# Patient Record
Sex: Male | Born: 1990 | Race: White | Hispanic: No | Marital: Single | Smoking: Current every day smoker
Health system: Southern US, Community
[De-identification: ages and names within clinical notes are randomized; demographics above are authoritative.]

## PROBLEM LIST (undated history)

## (undated) DIAGNOSIS — F329 Major depressive disorder, single episode, unspecified: Secondary | ICD-10-CM

## (undated) DIAGNOSIS — F1210 Cannabis abuse, uncomplicated: Secondary | ICD-10-CM

## (undated) DIAGNOSIS — F419 Anxiety disorder, unspecified: Secondary | ICD-10-CM

## (undated) DIAGNOSIS — F319 Bipolar disorder, unspecified: Secondary | ICD-10-CM

## (undated) DIAGNOSIS — F32A Depression, unspecified: Secondary | ICD-10-CM

---
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
## 2009-01-11 IMAGING — CR DG CHEST 1V PORT
1 series · 1 of 1 positions shown · non-contrast
Comparison: none

REASON FOR EXAM: blacked out
COMMENTS:

PROCEDURE:     DXR - DXR PORTABLE CHEST SINGLE VIEW  - May 17, 2008  [DATE]
RESULT:     The lung fields are clear.  The heart, mediastinal and osseous
structures show no significant abnormalities.

[view not recorded]
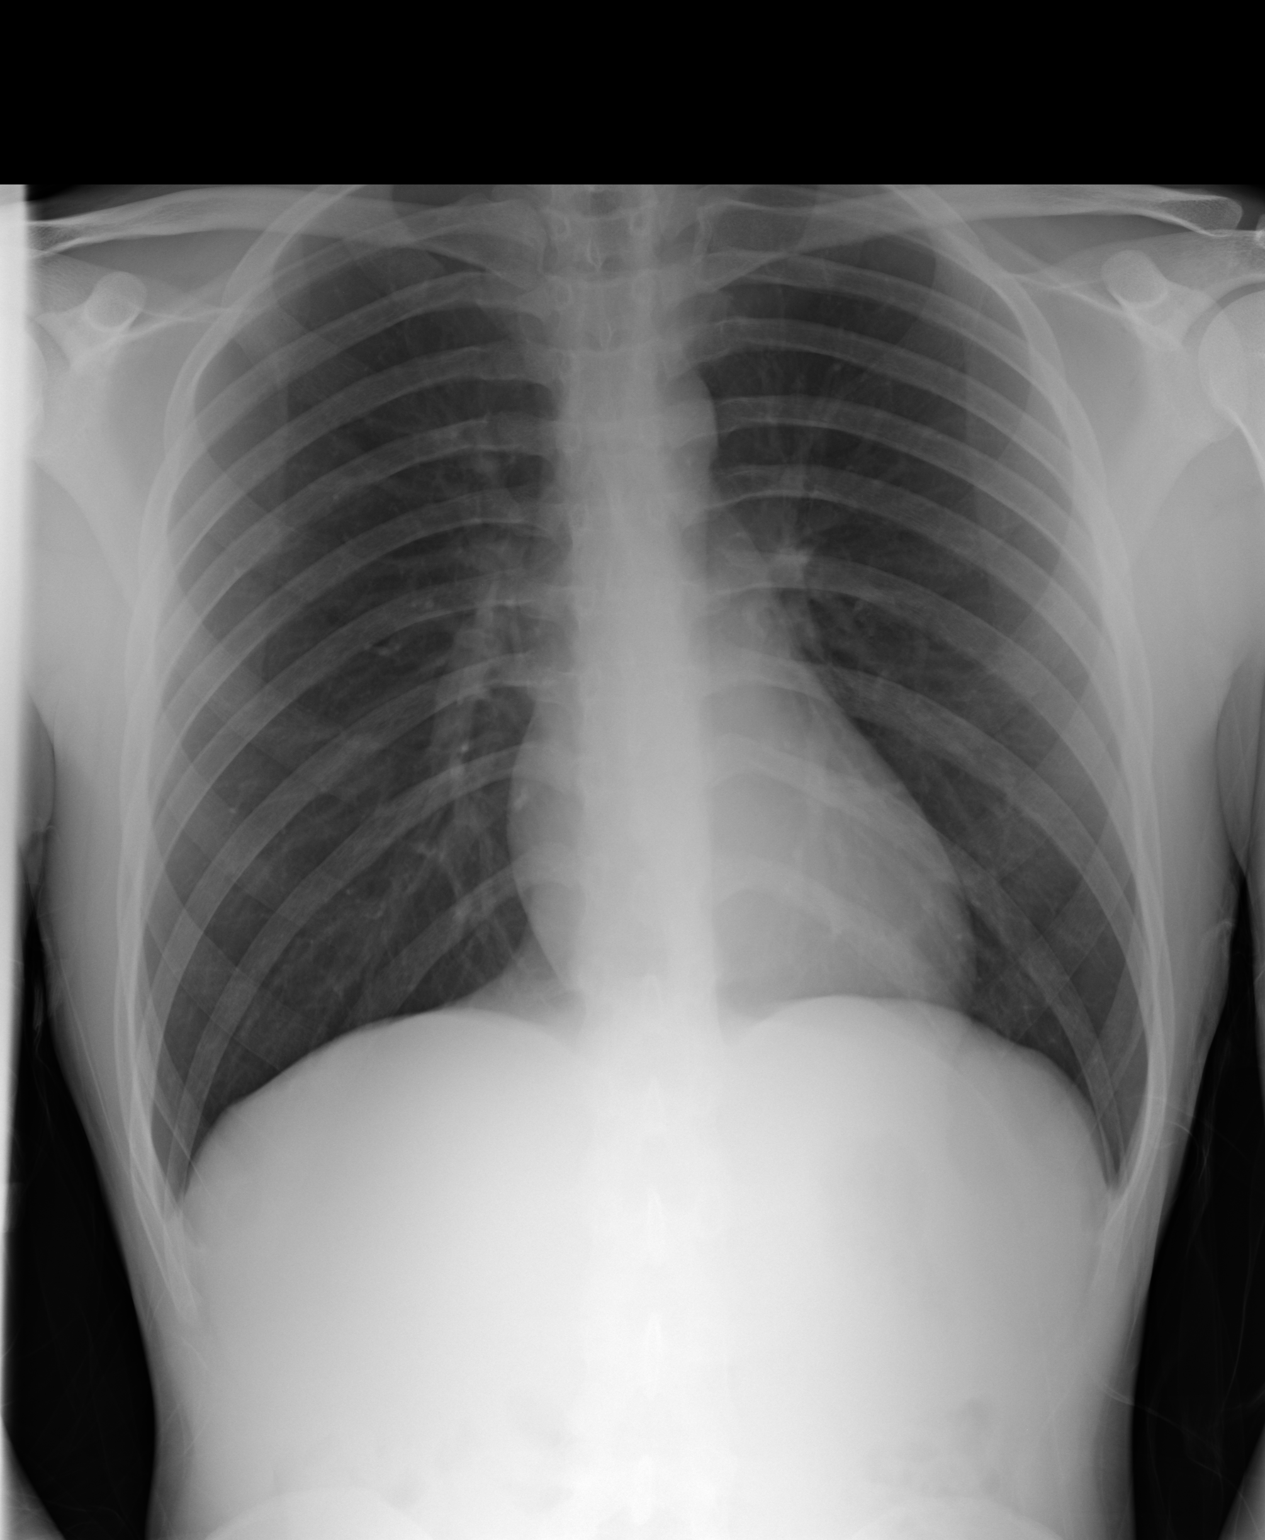

[1 of 1 positions shown; findings below may reference images not displayed]

IMPRESSION: No significant abnormalities are noted.

## 2014-05-12 LAB — COMPREHENSIVE METABOLIC PANEL
Albumin: 4.2 g/dL (ref 3.4–5.0)
Alkaline Phosphatase: 77 U/L
Anion Gap: 7 (ref 7–16)
BILIRUBIN TOTAL: 0.6 mg/dL (ref 0.2–1.0)
BUN: 14 mg/dL (ref 7–18)
CALCIUM: 8.9 mg/dL (ref 8.5–10.1)
CHLORIDE: 105 mmol/L (ref 98–107)
Co2: 26 mmol/L (ref 21–32)
Creatinine: 0.83 mg/dL (ref 0.60–1.30)
EGFR (African American): >60
Glucose: 99 mg/dL (ref 65–99)
OSMOLALITY: 276 (ref 275–301)
Potassium: 3.3 mmol/L — ABNORMAL LOW (ref 3.5–5.1)
SGOT(AST): 24 U/L (ref 15–37)
SGPT (ALT): 19 U/L (ref 12–78)
SODIUM: 138 mmol/L (ref 136–145)
Total Protein: 7.5 g/dL (ref 6.4–8.2)

## 2014-05-12 LAB — DRUG SCREEN, URINE
Amphetamines, Ur Screen: NEGATIVE (ref ?–1000)
BARBITURATES, UR SCREEN: NEGATIVE (ref ?–200)
Benzodiazepine, Ur Scrn: NEGATIVE (ref ?–200)
CANNABINOID 50 NG, UR ~~LOC~~: POSITIVE (ref ?–50)
COCAINE METABOLITE, UR ~~LOC~~: NEGATIVE (ref ?–300)
MDMA (ECSTASY) UR SCREEN: NEGATIVE (ref ?–500)
METHADONE, UR SCREEN: NEGATIVE (ref ?–300)
Opiate, Ur Screen: NEGATIVE (ref ?–300)
PHENCYCLIDINE (PCP) UR S: NEGATIVE (ref ?–25)
Tricyclic, Ur Screen: NEGATIVE (ref ?–1000)

## 2014-05-12 LAB — URINALYSIS, COMPLETE
Bacteria: NONE SEEN
Bilirubin,UR: NEGATIVE
Blood: NEGATIVE
GLUCOSE, UR: NEGATIVE mg/dL (ref 0–75)
LEUKOCYTE ESTERASE: NEGATIVE
NITRITE: NEGATIVE
PH: 6 (ref 4.5–8.0)
RBC,UR: 3 /HPF (ref 0–5)
SPECIFIC GRAVITY: 1.03 (ref 1.003–1.030)
Squamous Epithelial: <1
WBC UR: 6 /HPF (ref 0–5)

## 2014-05-12 LAB — ACETAMINOPHEN LEVEL

## 2014-05-12 LAB — CBC
HCT: 43.6 % (ref 40.0–52.0)
HGB: 14.5 g/dL (ref 13.0–18.0)
MCH: 29.9 pg (ref 26.0–34.0)
MCHC: 33.3 g/dL (ref 32.0–36.0)
MCV: 90 fL (ref 80–100)
Platelet: 254 10*3/uL (ref 150–440)
RBC: 4.85 10*6/uL (ref 4.40–5.90)
RDW: 12.8 % (ref 11.5–14.5)
WBC: 8.9 10*3/uL (ref 3.8–10.6)

## 2014-05-12 LAB — ETHANOL: Ethanol: <3 mg/dL

## 2014-05-12 LAB — SALICYLATE LEVEL: Salicylates, Serum: 3.9 mg/dL — ABNORMAL HIGH

## 2014-05-13 ENCOUNTER — Inpatient Hospital Stay: Payer: Self-pay | Admitting: Psychiatry

## 2014-05-18 LAB — POTASSIUM: Potassium: 4.2 mmol/L (ref 3.5–5.1)

## 2015-03-19 NOTE — H&P (Signed)
PATIENT NAME:  Bill Anderson, Bill Anderson MR#:  [id number] DATE OF BIRTH:  Sep 22, 1991  DATE OF ADMISSION:  05/13/2014  REFERRING PHYSICIAN: Emergency Room M.D.   ATTENDING PHYSICIAN: Jolanta B. Jennet MaduroPucilowska, M.D.   IDENTIFYING DATA: Mr. Bill Anderson is a 24 year old male with history of depression.   CHIEF COMPLAINT: "I'm dead already."    HISTORY OF PRESENT ILLNESS:  Mr. Bill Anderson reports a long history of depression since  childhood that worsened during his adolescence. At that time, he had several psychiatric hospitalizations for suicidal thoughts and also for cutting. He, more recently, has been tried on Risperdal and Celexa, did not feel that medications were helpful. In addition, the Risperdal gave him twitches. He usually is able to a fight depression with good coping skills that he learned during hospitalization, but at times he feels overwhelmed lately. After he lost his job 3 months ago, he started thinking of suicide more and thinking about ways to do it. On the day of admission, he superficially scratched his wrist again. He denies that it was a suicide attempt, but rather he was trying to substitute emotional pain with physical pain. He oftentimes feels that life is not worth living and he feels so depressed that he thinks that he is dead already and nothing will ever improve. He reports poor sleep, decreased appetite, anhedonia, feeling of guilt, hopelessness, worthlessness, poor energy and concentration, social isolation, crying spells, and recently suicidal thoughts. He reports some symptoms suggestive of bipolar disorder and believes that he was diagnosed with bipolar as a child. He, however, decided not ever take lithium or Depakote as he is very familiar with their side effects. He denies or symptoms of mania. There are no psychotic symptoms. He denies alcohol or a drug use, except for marijuana  that he uses regularity.   PAST PSYCHIATRIC HISTORY: [facility] hospital[facility] and at [facility]. He was tried on Risperdal and Celexa, probably other medications in the past as well. He has a history of cutting with 2 scars on his forearm , but denies that they were suicide attempts.   FAMILY PSYCHIATRIC HISTORY: Mother with bipolar.   ALLERGIES: NO KNOWN DRUG ALLERGIES.   MEDICATIONS ON ADMISSION: None.   SOCIAL HISTORY: He lives with his mother. They have a complicated relationship. He has a GED and some college. He is on probation after spending  time in jail for a year, was released in March.  Maybe this is why he is not working, that he is a felon. He has no health insurance.   REVIEW OF SYSTEMS: CONSTITUTIONAL: No fevers or chills. No weight changes.  EYES: No double or blurred vision.  ENT: No hearing loss.  RESPIRATORY: No shortness of breath or cough.  CARDIOVASCULAR: No chest pain or orthopnea.  GASTROINTESTINAL: No abdominal pain, nausea, vomiting, or diarrhea.  GENITOURINARY: No incontinence or frequency.  ENDOCRINE: No heat or cold intolerance.  LYMPHATIC: No anemia or easy bruising.  INTEGUMENTARY: No acne or rash.  MUSCULOSKELETAL: Positive for diffuse pain. He is hurting everywhere.  NEUROLOGIC: No tingling or weakness.  PSYCHIATRIC: See history of present illness for details.   PHYSICAL EXAMINATION:  VITAL SIGNS: Blood pressure 120/76, pulse 52, respirations 18, temperature 98.2.  GENERAL: This is a slender young male in no acute distress.  HEENT: The pupils are equal, round, and reactive to light. Sclerae anicteric.  NECK: Supple. No thyromegaly.  LUNGS: Clear to auscultation. No dullness to percussion.  HEART: Regular rhythm and rate. No murmurs, rubs,  or gallops.  ABDOMEN: Soft, nontender, nondistended. Positive bowel sounds.  MUSCULOSKELETAL: Normal muscle strength in all extremities.  SKIN: No rashes or bruises.  LYMPHATIC: No cervical adenopathy.  NEUROLOGIC: Cranial nerves II-XII are intact.   LABORATORY DATA: Chemistries are within normal  limits with potassium 3.3. Blood alcohol level is 0. LFTs within normal limits. Urine toxicology screen positive for cannabinoids. CBC within normal limits. Urinalysis is not suggestive of urinary tract infection. Serum acetaminophen less than 2. Serum salicylates 3.9.  MENTAL STATUS EXAMINATION: On admission, the patient is alert and oriented to person, place, time and situation. He is pleasant, polite and cooperative. He is adequately groomed, long hair covering his eyes. He maintains good eye contact. His speech is of normal rhythm, rate and volume. Mood is depressed with normal affect. Thought process is logical and goal oriented. THOUGHT CONTENT: He denies thoughts of hurting himself or others actively, but oftentimes has passive thoughts of suicide or not wanting to be here and cut his wrist prior to admission. There are no delusions or paranoia. There are no auditory or visual hallucinations. His cognition is grossly intact. His registration and recall, long and short-term memory seem intact. He is quite intelligent, good fund of knowledge.  Adequate insight and judgment.   DIAGNOSES:  AXIS I: Bipolar affective disorder, depressed, and cannabis abuse.  AXIS II: Deferred.  AXIS III: Deferred.  AXIS IV: Mental illness, substance abuse, access to care, treatment compliance, family conflict, employment, and legal.  AXIS V: Global assessment of functioning 25.   PLAN: The patient was admitted to [facility] Medicine Unit for safety, stabilization, and medication management. He was initially placed on suicide precautions and was closely monitored for any unsafe behaviors. He underwent full psychiatric and risk assessment. He received pharmacotherapy, individual and group psychotherapy, substance abuse counseling, and support from therapeutic milieu.   1.  Suicidal ideation, the patient is able to contract for safety.  2.  He was started on Remeron by Dr. Garnetta Buddy. We  discussed mood stabilizers at length. He agreed to try Tegretol and Latuda.   DISPOSITION: He will be discharged to home.   SUBSTANCE ABUSE: He is not interested in substance abuse treatment.   ____________________________ Ellin Goodie. Jennet Maduro, MD jbp:ts D: 05/14/2014 14:58:50 ET T: 05/14/2014 15:43:46 ET JOB#: [id number]  cc: Jolanta B. Jennet Maduro, MD, <Dictator> Shari Prows MD ELECTRONICALLY SIGNED 06/09/2014 4:48

## 2015-03-19 NOTE — Consult Note (Signed)
PATIENT NAME:  Bill Anderson, Bill Anderson MR#:  [id number] DATE OF BIRTH:  1991-05-07  DATE OF CONSULTATION:  05/17/2014  REFERRING PHYSICIAN:  Jolanta B. Jennet Maduro, MD CONSULTING PHYSICIAN:  Vipul S. Sherryll Burger, MD  PRIMARY CARE PHYSICIAN: None.   REASON FOR CONSULTATION: Penile ulcer.   HISTORY OF PRESENT ILLNESS: The patient is a 24 year old male with no known medical history. He is being seen for penile bump. The patient was admitted to psychiatry service on 18th of June for depression and bipolar disorder, is being managed by them but was noticed to have a small furuncle on his penis, for which we are being consulted for further evaluation and management. The patient reports having tick bite a couple of days ago before being admitted to the hospital, for which he was having some itching and started having small pimple on his penis, which started getting pustular and he drained by himself and is open. Had some pus coming out yesterday and has been feeling much better since then but has been having some itching, for which he requested physician to evaluate.   PAST MEDICAL HISTORY: None.   PAST PSYCHIATRIC HISTORY: Bipolar disorder. Several hospitalizations at [facility], at [facility].   ALLERGIES: No known drug allergies.   SOCIAL HISTORY: No smoking. No alcohol. No IV drugs of abuse.  FAMILY HISTORY: Mother with bipolar disorder.   MEDICATIONS AT HOME: None.   SOCIAL HISTORY: He lives with his mother. They have complicated relationship. He is on probation after spending time in a jail for a year. He was released in March.   REVIEW OF SYSTEMS: CONSTITUTIONAL: No fever, fatigue, weakness.  EYES: No blurred or double vision.  ENT: No tinnitus or ear pain.  RESPIRATORY: No cough, wheezing, hemoptysis.  CARDIOVASCULAR: No chest pain, orthopnea, edema.  GASTROINTESTINAL: No nausea, vomiting, diarrhea. GENITOURINARY: No dysuria or hematuria. He does have a small healing pustule on his penile  shaft. No signs of infection around it.  SKIN: As mentioned above.  MUSCULOSKELETAL: Generalized achiness.  NEUROLOGIC: No tingling, numbness, weakness.  PSYCHIATRIC: Positive for depression.   PHYSICAL EXAMINATION: VITAL SIGNS: Temperature 98.2, heart rate 76 per minute, respirations 18 per minute, blood pressure 128/89 mmHg. He is saturating 98% on room air.  GENERAL: The patient is a 24 year old male lying in the bed comfortably without any acute distress.  EYES: Pupils equal, round, reactive to light and accommodation. No scleral icterus. Extraocular muscles intact.  HENT: Head atraumatic, normocephalic. Oropharynx and nasopharynx clear.  NECK: Supple. No jugular venous distention. No thyroid enlargement or tenderness.  LUNGS: Clear to auscultation bilaterally. No wheezing, rales, rhonchi or crepitation.  CARDIOVASCULAR: S1, S2 normal. No murmur, rubs or gallop.  ABDOMEN: Soft, nontender, nondistended. Bowel sounds present. No organomegaly or mass.  EXTREMITIES: No pedal edema, cyanosis or clubbing.  GENITOURINARY: On his penile area in the midshaft region on the dorsal aspect, he has a 0.25 cm healing pustule. No signs of infection around it. It seems like his pulse is already out. No tenderness around it. No signs of excoriation around it.  NEUROLOGIC: Cranial nerves II through XII intact. Muscle strength 5/5 in all extremities. Sensation intact.  PSYCHIATRIC: The patient is alert and oriented x 3. He seems somewhat anxious. He keeps requesting some cream to apply on his penis even though we talked about it for long, that there is no benefit to it. He is specifically requesting steroid cream.   LABORATORY DATA: The last laboratory checked was on 17th of June when  he had a potassium of 3.3, otherwise normal. Urine tox was positive for cannabinoid. CBC was within normal limits.   IMPRESSION AND PLAN: Penile lesion, possibly from tick bite. We will start him on doxycycline for now. The pus   is already out. Will get rickettsial antibody along with Ehrlichia antibody. Will also check for sexually transmitted diseases, as he has been requesting. Blood test has been ordered. We will get routine labs for the morning.   CODE STATUS: Full code.   TOTAL TIME TAKING CARE OF THIS PATIENT: 35 minutes.   ____________________________ Ellamae SiaVipul S. Sherryll BurgerShah, MD vss:jcm D: 05/17/2014 17:26:03 ET T: 05/17/2014 19:57:45 ET JOB#: [id number]  cc: Vipul S. Sherryll BurgerShah, MD, <Dictator> Jolanta B. Jennet MaduroPucilowska, MD Patricia PesaVIPUL S SHAH MD ELECTRONICALLY SIGNED 05/20/2014 12:34

## 2015-03-19 NOTE — Consult Note (Signed)
PATIENT NAME:  Bill Anderson, Bill Anderson MR#:  [id number] DATE OF BIRTH:  December 09, 1990  DATE OF CONSULTATION:  05/13/2014  REFERRING PHYSICIAN:   CONSULTING PHYSICIAN:  Uzma S. Garnetta Buddy, MD  REASON FOR CONSULTATION: "I don't want to deal with life."   HISTORY OF PRESENT ILLNESS: Patient is a 24 year old single Caucasian male who presented to the ED on a voluntary basis. He reported that his mother gave him a ride. He reported that he has been feeling progressively depressed now for a long time. Reported that it has been there for a long time. He stated that he superficially cut himself yesterday as a knife was not too sharp. Reported that he was brought to the hospital by his mother. He feels depressed, hopeless, and helpless. Has been having suicidal ideations with a plan to hurt himself. Reported that he does not want to be here anymore. He smokes weed on a daily basis. He has started using weed since he was 24 years old. He is currently unemployed and has not been working for the past three months. He does not feel good. He does not remember how many hours he has been sleeping. He stated that he does not eat well and does not have much to lose at this time. He reported that he was started on Celexa while he was incarcerated last year and was released in March. He reported that he was incarcerated for breaking and entering, but he took the blame for his friend. He is currently on six month probation. The patient reported that he has uncontrolled pain and he feels sore. He reported that he does not want to deal with life anymore and is unable to contract for safety at this time.   PAST PSYCHIATRIC HISTORY: The patient reported that he was started on Celexa in the prison and has been taking the medication, but it is not helpful. He currently denied any previous history of suicide attempt but is unable to control his emotions.   FAMILY HISTORY: The patient reported that he does not have any family history of mental illness.    ALLERGIES: NO KNOWN DRUG ALLERGIES.  SUBSTANCE ABUSE HISTORY: The patient reported using marijuana since he was in his teenage years. He denied using alcohol and other illicit drugs.   PAST MEDICAL HISTORY: The patient currently denied having any medical problems including diabetes, GERD, or hypertension.   SOCIAL HISTORY: He currently lives with his mother. He has completed his GED. He is currently on probation. Does not have any relationship at this time.   REVIEW OF SYSTEMS:  CONSTITUTIONAL: Denies any fever or chills. No weight changes.  EYES: No double or blurred vision.  RESPIRATORY: No shortness of breath or cough.  CARDIOVASCULAR: No chest pain or orthopnea.  GASTROINTESTINAL: No abdominal pain, nausea, vomiting, or diarrhea.  GENITOURINARY: No incontinence or frequency.  ENDOCRINE: No heat or cold intolerance.  LYMPHATIC: No anemia or easy bruising.  INTEGUMENTARY: No acne or rash.  MUSCULOSKELETAL: Complaining of muscle pain.  NEUROLOGIC: No tingling or weakness.   VITAL SIGNS: Temperature is 97.3, pulse is 68, respirations 18, blood pressure 119/56.  LABORATORY DATA: Glucose 99, BUN 14, creatinine 0.83, sodium 138, potassium 3.3, chloride 105, bicarbonate 26, anion gap 7, and calcium 8.9. Blood alcohol level less than three. Protein 7.5, albumin 4.2, bilirubin 0.6, alkaline phosphatase 77, AST 24, ALT 19. UDS is positive for cannabinoids. WBC 8.9, RBC 4.85, hemoglobin 14.5, hematocrit 43.6, MCV 90, RDW 12.8.   MENTAL STATUS EXAM: The patient is  a thinly built male who was lying in the bed. He appears thinly built, muscle tone appears normal. Gait and station was within normal limits. Speech was low in tone and volume. Thought process was logical, goal-directed. Thought content was nondelusional. His insight and judgment were fair. He is awake, alert and oriented x 3. Recent and remote memory were intact. Attention span and concentration were normal. Language was intact. Fund  of knowledge appears normal. Mood is depressed. Affect is congruent having suicidal ideations with a plan to hurt himself.   DIAGNOSTIC IMPRESSION:  AXIS I: Major depressive disorder, recurrent and severe, without psychotic features.  AXIS II: None.  AXIS III: None reported.   TREATMENT PLAN:  1.  The patient is currently on involuntary commitment and [facility] for [facility] and safety.  2.  He will be started on Remeron 15 mg p.o. at bedtime for his depressive symptoms.  3.  He will be evaluated by the treatment team and his medications will be adjusted.  Thank you for allowing me to participate in the care of this patient.    ____________________________ Ardeen FillersUzma S. Garnetta BuddyFaheem, MD usf:ts D: 05/13/2014 13:14:08 ET T: 05/13/2014 13:28:09 ET JOB#: [id number]  cc: Ardeen FillersUzma S. Garnetta BuddyFaheem, MD, <Dictator> Rhunette CroftUZMA S FAHEEM MD ELECTRONICALLY SIGNED 05/13/2014 16:53

## 2015-03-19 NOTE — Consult Note (Signed)
Brief Consult Note: Diagnosis: penile ulcer.   Patient was seen by consultant.   Consult note dictated.   Recommend further assessment or treatment.   Orders entered.   Comments: * penile ulcer: he claims from tick bite which is very possible, will start doxy for now and get titers for ricketssial dz,. also check for STDs, blood tests ordered.  full code.  Electronic Signatures: Patricia PesaShah, Vipul S (MD)  (Signed 22-Jun-15 15:44)  Authored: Brief Consult Note   Last Updated: 22-Jun-15 15:44 by Patricia PesaShah, Vipul S (MD)

## 2018-03-20 ENCOUNTER — Encounter: Payer: Self-pay | Admitting: Emergency Medicine

## 2018-03-20 ENCOUNTER — Emergency Department
Admission: EM | Admit: 2018-03-20 | Discharge: 2018-03-23 | Disposition: A | Discharge status: Other Acute Inpatient Hospital | Payer: No Typology Code available for payment source | Attending: Emergency Medicine | Admitting: Emergency Medicine

## 2018-03-20 DIAGNOSIS — F17210 Nicotine dependence, cigarettes, uncomplicated: Secondary | ICD-10-CM | POA: Insufficient documentation

## 2018-03-20 DIAGNOSIS — F1210 Cannabis abuse, uncomplicated: Secondary | ICD-10-CM

## 2018-03-20 DIAGNOSIS — F1910 Other psychoactive substance abuse, uncomplicated: Secondary | ICD-10-CM | POA: Insufficient documentation

## 2018-03-20 DIAGNOSIS — F319 Bipolar disorder, unspecified: Secondary | ICD-10-CM

## 2018-03-20 DIAGNOSIS — R45851 Suicidal ideations: Secondary | ICD-10-CM | POA: Insufficient documentation

## 2018-03-20 DIAGNOSIS — Z9119 Patient's noncompliance with other medical treatment and regimen: Secondary | ICD-10-CM

## 2018-03-20 DIAGNOSIS — Z91199 Patient's noncompliance with other medical treatment and regimen due to unspecified reason: Secondary | ICD-10-CM

## 2018-03-20 DIAGNOSIS — T1491XA Suicide attempt, initial encounter: Secondary | ICD-10-CM

## 2018-03-20 HISTORY — DX: Major depressive disorder, single episode, unspecified: F32.9

## 2018-03-20 HISTORY — DX: Depression, unspecified: F32.A

## 2018-03-20 HISTORY — DX: Anxiety disorder, unspecified: F41.9

## 2018-03-20 LAB — CBC
HCT: 42.7 % (ref 40.0–52.0)
HEMOGLOBIN: 14.9 g/dL (ref 13.0–18.0)
MCH: 30.1 pg (ref 26.0–34.0)
MCHC: 35 g/dL (ref 32.0–36.0)
MCV: 85.9 fL (ref 80.0–100.0)
PLATELETS: 251 10*3/uL (ref 150–440)
RBC: 4.97 MIL/uL (ref 4.40–5.90)
RDW: 12.8 % (ref 11.5–14.5)
WBC: 13.1 10*3/uL — AB (ref 3.8–10.6)

## 2018-03-20 LAB — URINE DRUG SCREEN, QUALITATIVE (ARMC ONLY)
AMPHETAMINES, UR SCREEN: NOT DETECTED
Barbiturates, Ur Screen: NOT DETECTED
Benzodiazepine, Ur Scrn: NOT DETECTED
Cannabinoid 50 Ng, Ur ~~LOC~~: POSITIVE — AB
Cocaine Metabolite,Ur ~~LOC~~: NOT DETECTED
MDMA (ECSTASY) UR SCREEN: NOT DETECTED
Methadone Scn, Ur: NOT DETECTED
Opiate, Ur Screen: NOT DETECTED
Phencyclidine (PCP) Ur S: NOT DETECTED
TRICYCLIC, UR SCREEN: NOT DETECTED

## 2018-03-20 LAB — COMPREHENSIVE METABOLIC PANEL
ALT: 17 U/L (ref 17–63)
AST: 19 U/L (ref 15–41)
Albumin: 4.9 g/dL (ref 3.5–5.0)
Alkaline Phosphatase: 74 U/L (ref 38–126)
Anion gap: 7 (ref 5–15)
BUN: 14 mg/dL (ref 6–20)
CO2: 25 mmol/L (ref 22–32)
Calcium: 9.4 mg/dL (ref 8.9–10.3)
Chloride: 106 mmol/L (ref 101–111)
Creatinine, Ser: 0.77 mg/dL (ref 0.61–1.24)
Glucose, Bld: 109 mg/dL — ABNORMAL HIGH (ref 65–99)
POTASSIUM: 3.9 mmol/L (ref 3.5–5.1)
SODIUM: 138 mmol/L (ref 135–145)
Total Bilirubin: 0.8 mg/dL (ref 0.3–1.2)
Total Protein: 7.4 g/dL (ref 6.5–8.1)

## 2018-03-20 LAB — ACETAMINOPHEN LEVEL: Acetaminophen (Tylenol), Serum: <10 ug/mL — ABNORMAL LOW (ref 10–30)

## 2018-03-20 LAB — ETHANOL

## 2018-03-20 LAB — SALICYLATE LEVEL

## 2018-03-20 NOTE — ED Notes (Signed)
Snack and beverage given. 

## 2018-03-20 NOTE — ED Notes (Signed)
Pt. Talking to SOC psychiatrist. 

## 2018-03-20 NOTE — ED Notes (Signed)
Pt dressed out. Pt belongings bag: Pair of shoes, pants, briefs, shirt, bracelet.

## 2018-03-20 NOTE — ED Notes (Signed)
Per Dr Don PerkingVeronese 1:1 sitter to be lifted [facility]

## 2018-03-20 NOTE — ED Notes (Signed)
PT IVC PENDING SOC CONSULT/SOC CALLED. 

## 2018-03-20 NOTE — ED Notes (Signed)
IV[facility]/Consult ordered/[facility]

## 2018-03-20 NOTE — ED Notes (Signed)
Report to include Situation, Background, Assessment, and Recommendations received from GiGi RN. Patient alert and oriented, warm and dry, in no acute distress. Patient denies SI, HI, AVH and pain. Patient made aware of Q15 minute rounds and security cameras for their safety. Patient instructed to come to me with needs or concerns. 

## 2018-03-20 NOTE — ED Notes (Signed)
Report given to [facility] GiGi RN 
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
## 2018-03-20 NOTE — ED Notes (Signed)
Patient got transferred from main ED.Patient alert and oriented.Patient states "because of my mother I am here now.Please excuse me if I am rude I don't want to talk now."Informed patient about the 15 mts checks and the security ca [place]us[place].Encouraged patient to let nursing staff know any concern or need.
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
## 2018-03-20 NOTE — ED Notes (Signed)
Hourly rounding reveals patient in room. No complaints, stable, in no acute distress. Q15 minute rounds and monitoring via Security Cameras to continue. 

## 2018-03-20 NOTE — ED Triage Notes (Signed)
Pt comes into the ED via [employer] for IVC due to suicidal attempt. Patient was tased by police officers because he had a knife held up against his heart.  Patient has SI but denies HI at this time.  Patient explains that he doesn't want "to hurt myself, but if you asked me if I wanted to kill myself then it would be a different answer".  Patient is calm and cooperative at this time and in NAD.  Patient states he has a h/o depression, anxiety, and other mental health disorders.

## 2018-03-21 DIAGNOSIS — F1210 Cannabis abuse, uncomplicated: Secondary | ICD-10-CM

## 2018-03-21 DIAGNOSIS — F319 Bipolar disorder, unspecified: Secondary | ICD-10-CM

## 2018-03-21 DIAGNOSIS — Z91199 Patient's noncompliance with other medical treatment and regimen due to unspecified reason: Secondary | ICD-10-CM

## 2018-03-21 DIAGNOSIS — Z9119 Patient's noncompliance with other medical treatment and regimen: Secondary | ICD-10-CM

## 2018-03-21 HISTORY — DX: Bipolar disorder, unspecified: F31.9

## 2018-03-21 HISTORY — DX: Cannabis abuse, uncomplicated: F12.10

## 2018-03-21 MED ORDER — OLANZAPINE 10 MG PO TABS
10.0000 mg | ORAL_TABLET | Freq: Four times a day (QID) | ORAL | Status: DC | PRN
  Administered 2018-03-21: 10 mg via ORAL
  Filled 2018-03-21 (×2): qty 1

## 2018-03-21 MED ORDER — ZIPRASIDONE MESYLATE 20 MG IM SOLR
20.0000 mg | Freq: Four times a day (QID) | INTRAMUSCULAR | Status: DC | PRN
  Administered 2018-03-22: 20 mg via INTRAMUSCULAR
  Filled 2018-03-21: qty 20

## 2018-03-21 MED ORDER — LURASIDONE HCL 40 MG PO TABS
40.0000 mg | ORAL_TABLET | Freq: Every day | ORAL | Status: DC
  Administered 2018-03-21 – 2018-03-23 (×3): 40 mg via ORAL
  Filled 2018-03-21 (×3): qty 1

## 2018-03-21 NOTE — ED Notes (Signed)
Patient in dayroom on phone.

## 2018-03-21 NOTE — ED Notes (Signed)
Patient in day room. 
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
## 2018-03-21 NOTE — ED Notes (Signed)
IVC/ Consult completed/ Plan to admit when medically clear  
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
## 2018-03-21 NOTE — ED Notes (Signed)
Hourly rounding reveals patient sleeping in room. No complaints, stable, in no acute distress. Q15 minute rounds and monitoring via Security Cameras to continue. 

## 2018-03-21 NOTE — BH Assessment (Signed)
Assessment Note  Bill Anderson is an [age] y.o. male.IVC pt brought into ED by PD due to threatening to stab himself in the heart with a knife. Pt endorses SI and no HI. Pt has legal issues including being on the sex offender registry and was recently released from prison. Pt stated several times during assessment, "It's hard being human garbage." Writer witnessed pt being argumentative and ill[facility] stating, "I'm probably 10x smarter than you. I'll get your license revoked. Ignore the problem like everyone else does." Pt blames mother for a great deal of his current problems stating that she refuses to help him get a license, which he feels will solve many of his issues. Pt stated, "I don't want to exist on this planet." Pt displayed no efforts of accountability or remorse for behaviors or attitude.   At time of assessment, pt very agitated and unable to provide logical content for some questions on assessment. Pt endorses no HI, AH, or VH.  Diagnosis: Depression  Past Medical History:  Past Medical History:  Diagnosis Date  . Anxiety   . Depression     History reviewed. No pertinent surgical history.  Family History: No family history on file.  Social History:  reports that he has been smoking cigarettes.  He has been smoking about 1.00 pack per day. He has never used smokeless tobacco. He reports that he drinks alcohol. He reports that he has current or past drug history. Drug: Marijuana.  Additional Social History:  Alcohol / Drug Use Pain Medications: see PTA Prescriptions: see PTA Over the Counter: see PTA History of alcohol / drug use?: Yes Longest period of sobriety (when/how long): unknown Negative Consequences of Use: Legal, Financial Substance #1 Name of Substance 1: marijuana 1 - Age of First Use: 10 1 - Amount (size/oz): varies 1 - Frequency: daily 1 - Duration: varies 1 - Last Use / Amount: yesterday Substance #2 Name of Substance 2: alcohol 2 -  Age of First Use: unknown 2 - Amount (size/oz): varies 2 - Frequency: varies 2 - Duration: varies  2 - Last Use / Amount: unknown  CIWA: CIWA-Ar BP: 123/80 Pulse Rate: 89 COWS:    Allergies: No Known Allergies  Home Medications:  (Not in a hospital admission)  OB/GYN Status:  No LMP for male patient.  General Assessment Data Location of Assessment: [facility] ED T[facility] Assessment: In system Is this a Tele or Face-to-Face Assessment?: Face-to-Face Is this an Initial Assessment or a Re-assessment for this encounter?: Initial Assessment Marital status: Single Maiden name: N/A Is patient pregnant?: No Pregnancy Status: No Living Arrangements: Parent(PT lives with mother) Can pt return to current living arrangement?: Yes Admission Status: Involuntary Is patient capable of signing voluntary admission?: No Referral Source: Self/Family/Friend Insurance type: None  Medical Screening Exam [facility] ONLY) Medical Exam completed: Yes  Crisis Care Plan Living Arrangements: Parent(PT lives with mother) Legal Guardian: (N/A) Name of Psychiatrist: None indicated Name of Therapist: None indicated  Education Status Is patient currently in school?: No Is the patient employed, unemployed or receiving disability?: Unemployed  Risk to self with the past 6 months Suicidal Ideation: Yes-Currently Present Has patient been a risk to self within the past 6 months prior to admission? : Yes Suicidal Intent: Yes-Currently Present Has patient had any suicidal intent within the past 6 months prior to admission? : Yes Is patient at risk for suicide?: Yes Suicidal Plan?: Yes-Currently Present Has patient had any suicidal plan within the past 6  months prior to admission? : Yes Specify Current Suicidal Plan: Pt has plan of stabbing himself in the chest with a knife Access to Means: Yes Specify Access to Suicidal Means: knives within home What has been your use of drugs/alcohol within the last 12  months?: pt reports "I only smoke weed" Previous Attempts/Gestures: (Unknown) How many times?: (Unknown) Other Self Harm Risks: None indicated Triggers for Past Attempts: Family contact, Spouse contact(Legal issues, contact with mom and girlfriend) Intentional Self Injurious Behavior: None Family Suicide History: No Recent stressful life event(s): Conflict (Comment), Trauma (Comment), Turmoil (Comment), Legal Issues Persecutory voices/beliefs?: No Depression: Yes Depression Symptoms: Isolating, Feeling angry/irritable, Feeling worthless/self pity, Loss of interest in usual pleasures Substance abuse history and/or treatment for substance abuse?: Yes Suicide prevention information given to non-admitted patients: Yes  Risk to Others within the past 6 months Homicidal Ideation: No Does patient have any lifetime risk of violence toward others beyond the six months prior to admission? : Yes (comment) Thoughts of Harm to Others: No Current Homicidal Intent: No Current Homicidal Plan: No Access to Homicidal Means: No Identified Victim: N/A History of harm to others?: (Pt charges for breaking/entering and is on sexual offend reg) Assessment of Violence: On admission Violent Behavior Description: Pt reportedly became irate towards mother and threatened to stab himself in the chest. Pt  Does patient have access to weapons?: Yes (Comment)(Knives in home) Criminal Charges Pending?: No(None found) Does patient have a court date: No(None found) Is patient on probation?: Unknown  Psychosis Hallucinations: None noted Delusions: None noted  Mental Status Report Appearance/Hygiene: Disheveled, Unremarkable Eye Contact: Fair Motor Activity: Agitation Speech: Aggressive, Rapid, Word salad Level of Consciousness: Alert, Irritable Mood: Irritable, Angry, Anxious Affect: Angry, Irritable, Preoccupied Anxiety Level: Minimal Thought Processes: Flight of Ideas(Pt argumentative and long winded while  describing triggers) Judgement: Unimpaired Orientation: Appropriate for developmental age Obsessive Compulsive Thoughts/Behaviors: None  Cognitive Functioning Concentration: Decreased Memory: Recent Intact Is patient IDD: No(Unknown, pt does not present with DD symptoms) Is patient DD?: Unknown Insight: Poor Impulse Control: Poor Appetite: Fair Have you had any weight changes? : No Change Sleep: No Change Total Hours of Sleep: 6 Vegetative Symptoms: None  ADLScreening [facility] Assessment Services) Patient's cognitive ability adequate to safely complete daily activities?: Yes Patient able to express need for assistance with ADLs?: Yes Independently performs ADLs?: Yes (appropriate for developmental age)  Prior Inpatient Therapy Prior Inpatient Therapy: (unknown)  Prior Outpatient Therapy Prior Outpatient Therapy: (unknown)  ADL Screening (condition at time of admission) Patient's cognitive ability adequate to safely complete daily activities?: Yes Is the patient deaf or have difficulty hearing?: No Does the patient have difficulty seeing, even when wearing glasses/contacts?: No Does the patient have difficulty concentrating, remembering, or making decisions?: No Patient able to express need for assistance with ADLs?: Yes Does the patient have difficulty dressing or bathing?: No Independently performs ADLs?: Yes (appropriate for developmental age) Does the patient have difficulty walking or climbing stairs?: No Weakness of Legs: None Weakness of Arms/Hands: None  Home Assistive Devices/Equipment Home Assistive Devices/Equipment: None  Therapy Consults (therapy consults require a physician order) PT Evaluation Needed: No OT Evalulation Needed: No SLP Evaluation Needed: No Abuse/Neglect Assessment (Assessment to be complete while patient is alone) Abuse/Neglect Assessment Can Be Completed: Yes Physical Abuse: Denies Verbal Abuse: Denies, provider concerned (Comment)(Pt very  angry towards mom. Didnt state verbal abuse, but is angry towards her) Sexual Abuse: Denies Exploitation of patient/patient's resources: Denies Self-Neglect: Denies Values / Beliefs Cultural Requests  During Hospitalization: None Spiritual Requests During Hospitalization: None Consults Spiritual Care Consult Needed: No Social Work Consult Needed: No      Additional Information 1:1 In Past 12 Months?: No CIRT Risk: No Elopement Risk: No Does patient have medical clearance?: Yes     Disposition:  Disposition Initial Assessment Completed for this Encounter: Yes Disposition of Patient: (Pending psych consult) Patient refused recommended treatment: No Mode of transportation if patient is discharged?: (Transported by PD) Patient referred to: (Pending psych consult)  On Site Evaluation by:   Reviewed with Physician:    Lattie Haw  Iren Whipp 03/21/2018 2:21 AM

## 2018-03-21 NOTE — ED Notes (Signed)
Patient was irritable this morning states "I need to talk to my girl friend.Why don't you provide FB or IG.Thats the only way I can contact her."Patient was cursing a peer.Staff able to verbally deescalate the patient.Patient denies SI,HI and AVH at this time.

## 2018-03-21 NOTE — BH Assessment (Signed)
Patient is to be admitted [facility] by Dr. Toni Amendlapacs.  Attending Physician will be Dr. Johnella MoloneyMcNew.   [facility] [place].   ER staff is aware of the admission:  Glenda, ER Sectary   Dr. Marisa SeverinSiadecki, ER MD   Clydie BraunKaren, Patient's Nurse   Mertie ClauseJeanelle, Patient Access.

## 2018-03-21 NOTE — ED Provider Notes (Signed)
-----------------------------------------   7:16 AM on 03/21/2018 -----------------------------------------   Blood pressure 123/80, pulse 89, temperature 98.2 F (36.8 C), temperature source Oral, resp. rate 16, height 5\' 3"  (1.6 m), weight 54.4 kg (120 lb), SpO2 100 %.  The patient had no acute events since last update.  Calm and cooperative at this time.  Disposition is pending Psychiatry/Behavioral Medicine team recommendations.     Irean HongSung, Jade J, MD 03/21/18 [id number]

## 2018-03-22 NOTE — ED Notes (Signed)
Pt has taken a shower. No behavioral issues. Maintained on 15 minute checks and observation by security camera for safety. 

## 2018-03-22 NOTE — ED Notes (Signed)
Pt initially refused VS. After pt contacted his girlfriend he was more cooperative.Vs obtained.  RN explained to patient his cooperation was required for him to be transferred. Pt accepting.

## 2018-03-22 NOTE — ED Notes (Signed)
PT IVC/ PENDING PLACEMENT  

## 2018-03-22 NOTE — ED Notes (Signed)
[place] # [phone]

## 2018-03-22 NOTE — ED Provider Notes (Signed)
-----------------------------------------   6:35 AM on 03/22/2018 -----------------------------------------   Blood pressure 127/84, pulse 88, temperature (!) 97.5 F (36.4 C), temperature source Oral, resp. rate 18, height  (1.6 m), weight 54.4 kg (120 lb), SpO2 98 %.  The patient had no acute events since last update.  Calm and cooperative at this time.  Disposition is pending Psychiatry/Behavioral Medicine team recommendations.     Irean Hong, MD 03/22/18 [phone]

## 2018-03-22 NOTE — ED Notes (Signed)
Patient calm and cooperative.  Pt. In dayroom talking with other patients.  Pt. Advised to come to this nurse with any concerns.

## 2018-03-22 NOTE — ED Notes (Addendum)
Pt in dayroom interacting with other patients and staff.  Maintained on 15 minute checks and observation by security camera for safety.

## 2018-03-22 NOTE — ED Notes (Signed)
Patient watching TV in room. No noted distress or abnormal behaviors noted. Will continue 15 minute checks and observation by security camera for safety. 

## 2018-03-22 NOTE — ED Notes (Signed)
Pt calm, cooperative. Engaging in conversation with staff. Compliant with medication. Maintained on 15 minute checks and observation by security camera for safety.

## 2018-03-22 NOTE — Progress Notes (Signed)
Late Submission:  This patient was upset and made a verbal request to talk to this social work. He was provided breif 1-1 individual support and this worker listened to his Human rights violation complaints. LCSW reviewed at length the difference between prison and in patient hospital stays and how different rules of care apply. The patient remained calm and was able to remember the benefits of impatient care in the BMU. Patient was agreeable to remain calm and understood that ramping up was not a good option. He agreed to quiet down in the BHU. No further needs expressed.  [employer] LCSW [phone]

## 2018-03-22 NOTE — ED Notes (Addendum)
Pt complaining of room temp being to hot and that his rights were being violated. Pt would not calm down, resisted to take shot when nurse approached him. Was combative during the procedure and had to have hands on to finish procedure.

## 2018-03-22 NOTE — ED Notes (Signed)
Pt calm and cooperative with this RN. Pt told this Clinical research associate he had an "issue" last night. Pt stated he only became agitated because other patients were moved to the floor before him and he has been here longer.  "They violated my religion by giving me Geodon."  Pt offered emotional support. Maintained on 15 minute checks and observation by security camera for safety.

## 2018-03-23 ENCOUNTER — Other Ambulatory Visit: Payer: Self-pay

## 2018-03-23 ENCOUNTER — Inpatient Hospital Stay
Admission: AD | Admit: 2018-03-23 | Discharge: 2018-03-26 | DRG: 885 | Disposition: A | Discharge status: Home / Self Care | Payer: No Typology Code available for payment source | Attending: Psychiatry | Admitting: Psychiatry

## 2018-03-23 DIAGNOSIS — F17210 Nicotine dependence, cigarettes, uncomplicated: Secondary | ICD-10-CM | POA: Diagnosis present

## 2018-03-23 DIAGNOSIS — F1290 Cannabis use, unspecified, uncomplicated: Secondary | ICD-10-CM | POA: Diagnosis present

## 2018-03-23 DIAGNOSIS — F3181 Bipolar II disorder: Principal | ICD-10-CM | POA: Diagnosis present

## 2018-03-23 DIAGNOSIS — Z915 Personal history of self-harm: Secondary | ICD-10-CM | POA: Diagnosis not present

## 2018-03-23 HISTORY — DX: Bipolar disorder, unspecified: F31.9

## 2018-03-23 HISTORY — DX: Cannabis abuse, uncomplicated: F12.10

## 2018-03-23 MED ORDER — NICOTINE 21 MG/24HR TD PT24
21.0000 mg | MEDICATED_PATCH | Freq: Every day | TRANSDERMAL | Status: DC
  Filled 2018-03-23 (×2): qty 1

## 2018-03-23 MED ORDER — MAGNESIUM HYDROXIDE 400 MG/5ML PO SUSP: 30.0000 mL | Freq: Every day | ORAL | Status: DC | PRN

## 2018-03-23 MED ORDER — ALUM & MAG HYDROXIDE-SIMETH 200-200-20 MG/5ML PO SUSP: 30.0000 mL | ORAL | Status: DC | PRN

## 2018-03-23 MED ORDER — ACETAMINOPHEN 325 MG PO TABS: 650.0000 mg | ORAL_TABLET | Freq: Four times a day (QID) | ORAL | Status: DC | PRN

## 2018-03-23 MED ORDER — LURASIDONE HCL 40 MG PO TABS
40.0000 mg | ORAL_TABLET | Freq: Every day | ORAL | Status: DC
  Administered 2018-03-24 – 2018-03-25 (×2): 40 mg via ORAL
  Filled 2018-03-23 (×2): qty 1

## 2018-03-23 NOTE — ED Notes (Signed)
Patient given dinner tray.

## 2018-03-23 NOTE — Progress Notes (Signed)
D:Pt denies SI/HI/AVH this evening and denies any plan to harm self or others. Pt. Verbally is able to contract for safety. Pt. Reports he can remain safe while on the unit. Pt. Reports depression this evening "5/10". No complaints of any pain. Pt. Reports doing, "better" this evening then when he first was admitted and in crisis he states. Pt. Observed frequently socializing with peers and staff and active in the milieu. Pt. Participates in snacks. Pt. Behavior during assessment observed as hypervigilant, childlike, utilizing pressured speech and grandiose periodically. Pt. Appears preoccupied with the incident that caused him to be placed under IVC. Pt. Compliant this evening with EKG testing, with copy placed on chart for MD review. Pt. During interactions makes comments such as, "I am so much smarter than probably just about everyone here, I'm telling you you're going to be surpised", "I'm going to talk to my doctor tomorrow about getting me switched over to medical marijuana..medical marijuana is totally natural and would clear up all of my problems and the worlds frankly". Pt. During assessment appears to identify/blame himself/others for his problems. Pt. Periodically up at the nursing station asking for printouts of coloring book pages so that he can draw his girlfriend stuff.     A: Q x 15 minute observation checks were completed for safety. Patient was provided with education. Patient  was encourage to attend groups, participate in unit activities and continue with plan of care. Pt. Care plans reviewed. Support and encouragement given this evening.   R:Patient is complaint with unit procedures.            Precautionary checks every 15 minutes for safety maintained, room free of safety hazards, patient sustains no injury or falls during this shift.

## 2018-03-23 NOTE — Progress Notes (Signed)
D: Received patient from BHU. Patient skin assessment completed with [place], and [place] RN, skin is intact, no contraband found. Patient is no fall risk and has no precautions. Patient reports passive SI, without and plan and contracts for safety. Patient is admitted after attempted suicide by stabbing. Patient reports being hopeless/helpless stating "I don't feel like I should exist in this world." Patient blames self and mother for reported depression. Patient is very arrogant stating "I could run this place, I have a great mind. I am so much smarter then anyone here" Patient also reports that "Sheran Fava will fix all my problems."    A: Patient oriented to unit/room/call light. Patient was offered support and encouragement. Patient was encourage to attend groups, participate in unit activities and continue with plan of care. Q x 15 minute observation checks were completed for safety.   R: Patient has no complaints at this time. Patient is receptive to treatment and safety maintained on unit.

## 2018-03-23 NOTE — ED Notes (Signed)
Patient discharge readmit to BMU. Report called to [facility]. Patient alert oriented. Patient aware of admission. All patient belongings taken to unit with patient. Patient transported via wheelchair with Security Police to unit. Patient vitals 97.5-123/88-78-18-100% room air.

## 2018-03-23 NOTE — Plan of Care (Signed)
Pt. Verbalizes understanding of education provided and unit rules. Pt. Reports doing, "better" this evening then when he first was admitted and in crisis he states. Pt. Observed frequently socializing with peers and staff and active in the milieu. Pt. Participates in snacks. Pt. Compliant with unit activities this evening and behavior compliant. Pt. Denies Si/HI this evening and denies any plan to hurt self or others. Pt. Verbalizes he can remain safe while on the unit and verbally contracts for safety.    Problem: Education: Goal: Knowledge of [facility] Education information/materials will improve Outcome: Progressing Goal: Emotional status will improve Outcome: Progressing   Problem: Activity: Goal: Interest or engagement in activities will improve Outcome: Progressing   Problem: Health Behavior/Discharge Planning: Goal: Compliance with treatment plan for underlying cause of condition will improve Outcome: Progressing   Problem: Self-Concept: Goal: Ability to disclose and discuss suicidal ideas will improve Outcome: Progressing   Problem: Safety: Goal: Ability to remain free from injury will improve Outcome: Progressing

## 2018-03-23 NOTE — ED Notes (Signed)
Pt refused shower stating he took one yesterday and cannot take a shower everyday or his hair will get to dry.

## 2018-03-23 NOTE — Plan of Care (Signed)
New admission.  Problem: Education: Goal: Knowledge of [facility] Education information/materials will improve Outcome: Not Progressing Goal: Emotional status will improve Outcome: Not Progressing Goal: Mental status will improve Outcome: Not Progressing Goal: Verbalization of understanding the information provided will improve Outcome: Not Progressing   Problem: Activity: Goal: Interest or engagement in activities will improve Outcome: Not Progressing Goal: Sleeping patterns will improve Outcome: Not Progressing   Problem: Health Behavior/Discharge Planning: Goal: Identification of resources available to assist in meeting health care needs will improve Outcome: Not Progressing Goal: Compliance with treatment plan for underlying cause of condition will improve Outcome: Not Progressing   Problem: Education: Goal: Ability to make informed decisions regarding treatment will improve Outcome: Not Progressing   Problem: Medication: Goal: Compliance with prescribed medication regimen will improve Outcome: Not Progressing   Problem: Self-Concept: Goal: Ability to disclose and discuss suicidal ideas will improve Outcome: Not Progressing Goal: Will verbalize positive feelings about self Outcome: Not Progressing

## 2018-03-23 NOTE — ED Notes (Signed)
Pt given breakfast tray

## 2018-03-23 NOTE — ED Notes (Signed)
Pt. Up using bathroom.  Pt. Requested to know time, information given, pt. Returned to room with steady gait.

## 2018-03-23 NOTE — ED Notes (Signed)
PT IVC/ Pending placement to [address] [facility].

## 2018-03-23 NOTE — Tx Team (Signed)
Initial Treatment Plan 03/23/2018 6:32 PM Bill Anderson ZOX:[id number]    PATIENT STRESSORS: Legal issue Medication change or noncompliance Other:  Effective Communication with his mother   PATIENT STRENGTHS: Ability for insight Active sense of humor Capable of independent living Communication skills   PATIENT IDENTIFIED PROBLEMS: Coping skills to prevent SI attempts  Medication compliance  Coping skills to live with his mother peacefully                 DISCHARGE CRITERIA:  Ability to meet basic life and health needs Adequate post-discharge living arrangements Improved stabilization in mood, thinking, and/or behavior  PRELIMINARY DISCHARGE PLAN: Return to previous living arrangement  PATIENT/FAMILY INVOLVEMENT: This treatment plan has been presented to and reviewed with the patient, Bill Anderson.  The patient has been given the opportunity to ask questions and make suggestions.  Rex Kras, RN 03/23/2018, 6:32 PM

## 2018-03-23 NOTE — ED Notes (Signed)
Patient is alert and verbal. Patient denies SI/HI and A/V hallucinations. Patient plan of care reviewed with him and he verbalizes understanding. Patient provided support and encouragement. Q 15 minute checks in progress and patient remains safe on unit. Monitoring continues.

## 2018-03-24 ENCOUNTER — Encounter: Payer: Self-pay | Admitting: Psychiatry

## 2018-03-24 DIAGNOSIS — F3181 Bipolar II disorder: Principal | ICD-10-CM

## 2018-03-24 LAB — LIPID PANEL
CHOL/HDL RATIO: 4 ratio
CHOLESTEROL: 156 mg/dL (ref 0–200)
HDL: 39 mg/dL — AB (ref >40)
LDL Cholesterol: 95 mg/dL (ref 0–99)
TRIGLYCERIDES: 108 mg/dL (ref <150)
VLDL: 22 mg/dL (ref 0–40)

## 2018-03-24 LAB — HEMOGLOBIN A1C
Hgb A1c MFr Bld: 5 % (ref 4.8–5.6)
Mean Plasma Glucose: 96.8 mg/dL

## 2018-03-24 LAB — TSH: TSH: 3.047 u[IU]/mL (ref 0.350–4.500)

## 2018-03-24 NOTE — Tx Team (Addendum)
Interdisciplinary Treatment and Diagnostic Plan Update  03/24/2018 Time of Session: [address] Bill Anderson MRN: [id number]  Principal Diagnosis: <principal problem not specified>  Secondary Diagnoses: Active Problems:   Bipolar depression (HCC)   Current Medications:  Current Facility-Administered Medications  Medication Dose Route Frequency Provider Last Rate Last Dose  . acetaminophen (TYLENOL) tablet 650 mg  650 mg Oral Q6H PRN Clapacs, John T, MD      . alum & mag hydroxide-simeth (MAALOX/MYLANTA) 200-200-20 MG/5ML suspension 30 mL  30 mL Oral Q4H PRN Clapacs, John T, MD      . lurasidone (LATUDA) tablet 40 mg  40 mg Oral Q supper Clapacs, John T, MD      . magnesium hydroxide (MILK OF MAGNESIA) suspension 30 mL  30 mL Oral Daily PRN Clapacs, John T, MD      . nicotine (NICODERM CQ - dosed in mg/24 hours) patch 21 mg  21 mg Transdermal Daily McNew, Ileene Hutchinson, MD       PTA Medications: No medications prior to admission.    Patient Stressors: Legal issue Medication change or noncompliance Other:  Effective Communication with his mother  Patient Strengths: Ability for insight Active sense of humor Capable of independent living Communication skills  Treatment Modalities: Medication Management, Group therapy, Case management,  1 to 1 session with clinician, Psychoeducation, Recreational therapy.   Physician Treatment Plan for Primary Diagnosis: <principal problem not specified> Long Term Goal(s):     Short Term Goals:    Medication Management: Evaluate patient's response, side effects, and tolerance of medication regimen.  Therapeutic Interventions: 1 to 1 sessions, Unit Group sessions and Medication administration.  Evaluation of Outcomes: Progressing  Physician Treatment Plan for Secondary Diagnosis: Active Problems:   Bipolar depression (HCC)  Long Term Goal(s):     Short Term Goals:       Medication Management: Evaluate patient's response, side effects, and  tolerance of medication regimen.  Therapeutic Interventions: 1 to 1 sessions, Unit Group sessions and Medication administration.  Evaluation of Outcomes: Progressing   RN Treatment Plan for Primary Diagnosis: <principal problem not specified> Long Term Goal(s): Knowledge of disease and therapeutic regimen to maintain health will improve  Short Term Goals: Ability to verbalize feelings will improve, Ability to identify and develop effective coping behaviors will improve and Compliance with prescribed medications will improve  Medication Management: RN will administer medications as ordered by provider, will assess and evaluate patient's response and provide education to patient for prescribed medication. RN will report any adverse and/or side effects to prescribing provider.  Therapeutic Interventions: 1 on 1 counseling sessions, Psychoeducation, Medication administration, Evaluate responses to treatment, Monitor vital signs and CBGs as ordered, Perform/monitor CIWA, COWS, AIMS and Fall Risk screenings as ordered, Perform wound care treatments as ordered.  Evaluation of Outcomes: Progressing   LCSW Treatment Plan for Primary Diagnosis: <principal problem not specified> Long Term Goal(s): Safe transition to appropriate next level of care at discharge, Engage patient in therapeutic group addressing interpersonal concerns.  Short Term Goals: Engage patient in aftercare planning with referrals and resources, Increase ability to appropriately verbalize feelings and Increase emotional regulation  Therapeutic Interventions: Assess for all discharge needs, 1 to 1 time with Social worker, Explore available resources and support systems, Assess for adequacy in community support network, Educate family and significant other(s) on suicide prevention, Complete Psychosocial Assessment, Interpersonal group therapy.  Evaluation of Outcomes: Progressing   Progress in Treatment: Attending groups:  Yes. Participating in groups: Yes. Taking medication as  prescribed: Yes. Toleration medication: Yes. Family/Significant other contact made: Yes, individual(s) contacted:  pt's mother. Patient understands diagnosis: Yes. Discussing patient identified problems/goals with staff: Yes. Medical problems stabilized or resolved: Yes. Denies suicidal/homicidal ideation: Yes. Issues/concerns per patient self-inventory: No. Other: None at this time.   New problem(s) identified: No, Describe:  none at this time.   New Short Term/Long Term Goal(s): Pt reported his goal is to, "learn coping skills to deal with my crazy and my mom's crazy."   Discharge Plan or Barriers: CSW will continue to assess for appropriate discharge plan.   Reason for Continuation of Hospitalization: Anxiety Depression Medication stabilization  Estimated Length of Stay: 3 days  Recreational Therapy: Patient Stressors: Family (Mom)  Patient Goal: Patient will identify benefit of making healthy decisions post d/c within 5 recreation therapy group sessions  Attendees: Patient: Bill Anderson 03/24/2018 11:34 AM  Physician: Dr. Johnella Moloney, MD 03/24/2018 11:34 AM  Nursing:  Milas Hock, RN  03/24/2018 11:34 AM  RN Care Manager: 03/24/2018 11:34 AM  Social Worker: Heidi Dach, LCSW 03/24/2018 11:34 AM  Recreational Therapist: Garret Reddish, CTRS-LRT 03/24/2018 11:34 AM  Other: Jake Shark, LCSW 03/24/2018 11:34 AM  Other:  03/24/2018 11:34 AM  Other: 03/24/2018 11:34 AM     Scribe for Treatment Team: Heidi Dach, LCSW 03/24/2018 11:42 AM
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
## 2018-03-24 NOTE — Progress Notes (Signed)
   03/24/18 1310  Clinical Encounter Type  Visited With Patient;Health care provider  Visit Type Initial (open order to visit patient)   Chaplain met with patient on unit.  He was asking about getting a new bracelet as he had picked at it since he's used to wearing a bracelet normally.  Chaplain reported this to staff after conversation with patient.  Chaplain spoke of order to visit patient to which he expressed surprise.  He said that he was pagan and didn't really need a visit.  Chaplain responded that she had had a diverse spiritual journey and had been pagan at one point.  Patient then spoke about his goddess/god, Nyx and her consort Erebus.  He views his religion as a "more scientific version" and doesn't see them as "dark but as neutral."  He said that in his religious tradition medicine should be all natural and that his current medications went against his beliefs but that he was cooperating by taking them as prescribed.  He spoke of the benefit of medical marijuana and how it was legal in this state.  He says that he has been advocating for it to be prescribed to him and spoke to its many benefits.  Patient wondered if chaplain could assist in addressing this.  Chaplain spoke of her scope of practice but indicated that she would mention patient's hope in her note.  Patient headed to group.

## 2018-03-24 NOTE — Progress Notes (Signed)
Recreation Therapy Notes  Date: 03/24/2018  Time: 9:30 am  Location: Craft Room  Behavioral response: N/A  Intervention Topic: Coping Skills  Discussion/Intervention:  Group content on today was focused on coping skills. The group defined what coping skills are and when they can be used. Individuals described how they normally cope with thing and the coping skills they normally use. Patients expressed why it is important to cope with things and how not coping with things can affect you. The group participated in the intervention "My coping box" and made coping boxes while adding coping skills they could use in the future to the box. Clinical Observations/Feedback:  Patient unable to attend group due to being with the Doctor at this time. Khalif Stender LRT/CTRS          Haadiya Frogge 03/24/2018 12:15 PM

## 2018-03-24 NOTE — Progress Notes (Signed)
CSW received a call from pt's mother, Kowen Kluth. Pt's mother stated, "Well, I guess somebody told him that he couldn't come back here. He called me yelling and screaming, and the was threatening me. He said when he gets out of there, he's going to come up here and kill me. I'm afraid of him." Pt's mother requested [facility] staff restrict pt from contacting her. CSW explained that we would ask pt not to contact her anymore, but it would be difficult to monitor calls in that manner. Pt's mother stated, "if he calls me again, I'm taking out a restraining order." CSW will continue to coordinate with pt's mother as needed for updates/discharge planning .  CSW informed [facility] staff of pt's mother's concerns. CSW also spoke with pt and asked him to please refrain from contacting his mother. Pt reported, "I don't want to talk to her. I'm done with her." Pt then asked CSW if she would set up "something with my grandmother." CSW informed pt he would need to contact his grandmother on his own. Pt expressed understanding and agreement with this information.   Heidi Dach, MSW, LCSW Clinical Social Worker 03/24/2018 12:46 PM

## 2018-03-24 NOTE — BHH Suicide Risk Assessment (Signed)
[facility] B Admission Suicide Risk Assessment   Nursing information obtained from:    Demographic factors:   27 yo male, in a relationship Current Mental Status:   im;ulsive Loss Factors:   loss of housing Historical Factors:   impulsivity, many suicide gestures Risk Reduction Factors:   motivation  Total Time spent with patient: 1 hour Principal Problem: Bipolar II disorder, most recent episode major depressive (HCC) Diagnosis:   Patient Active Problem List   Diagnosis Date Noted  . Bipolar II disorder, most recent episode major depressive (HCC) [F31.81] 03/24/2018    Priority: High  . Noncompliance [Z91.19] 03/21/2018   Subjective Data: See H&P  Continued Clinical Symptoms:  Alcohol Use Disorder Identification Test Final Score (AUDIT): 0 The "Alcohol Use Disorders Identification Test", Guidelines for Use in Primary Care, Second Edition.  World Science writer [facility]). Score between 0-7:  no or low risk or alcohol related problems. Score between 8-15:  moderate risk of alcohol related problems. Score between 16-19:  high risk of alcohol related problems. Score 20 or above:  warrants further diagnostic evaluation for alcohol dependence and treatment.   CLINICAL FACTORS:   Personality Disorders:   Cluster B     COGNITIVE FEATURES THAT CONTRIBUTE TO RISK:  None    SUICIDE RISK:   Mild:  Suicidal ideation of limited frequency, intensity, duration, and specificity.  There are no identifiable plans, no associated intent, mild dysphoria and related symptoms, good self-control (both objective and subjective assessment), few other risk factors, and identifiable protective factors, including available and accessible social support.  PLAN OF CARE: See H&P  I certify that inpatient services furnished can reasonably be expected to improve the patient's condition.   Haskell Riling, MD 03/24/2018, 4:45 PM
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
## 2018-03-24 NOTE — Plan of Care (Signed)
Patient states "my depression is long lasting thing it is like a deep pit need to be filled."Patient was upset and crying in his room because his mom wants to put a restraint order for him.Patient states "I still love my mom but she never loves me."Support and encouragement given.Denies SI,HI and AVH.Appetite and energy level good.

## 2018-03-24 NOTE — Progress Notes (Signed)
Recreation Therapy Notes  INPATIENT RECREATION THERAPY ASSESSMENT  Patient Details Name: Bill Anderson MRN: [id number] DOB: 18-Dec-1990 Today's Date: 03/24/2018       Information Obtained From: Patient  Able to Participate in Assessment/Interview: Yes  Patient Presentation: Responsive  Reason for Admission (Per Patient):    Patient Stressors: Family  Coping Skills:   Meditate, Deep Breathing, Substance Abuse  Leisure Interests (2+):  Games - Video games(Smoke weed)  Frequency of Recreation/Participation: Weekly  Awareness of Community Resources:  No  Community Resources:     Current Use:    If no, Barriers?:    Expressed Interest in [employer] Information: No(Every thing is far [place] live [place])  [place] of Residence:  Film/video editor  Patient Main Form of Transportation: Other (Comment)(Do not get out at all. )  Patient Strengths:  I do not think I am a good person  Patient Identified Areas of Improvement:  Understanding other peoples emotions  Patient Goal for Hospitalization:  Working on coping skills and get a plan  Current SI (including self-harm):  No  Current HI:  No  Current AVH: No  Staff Intervention Plan: Group Attendance, Collaborate with Interdisciplinary Treatment Team  Consent to Intern Participation: N/A  Chyrl Elwell 03/24/2018, 3:52 PM

## 2018-03-25 NOTE — Progress Notes (Signed)
[facility] MD Progress Note  03/25/2018 2:44 PM Bill Anderson  MRN:  [id number] Subjective:  Pt got into an argument with another peer on religion. He states, 'I think I handled it well." Pt states that overall his mood is improved. He is aware that he cannot go back to his mothers house. HE states, "She overreacts all the time." He is extremely blame placing on her. He states that he is going to try to get his registry switched to another county and then maybe try to stay with his grandmother or in [place]. He states, "I'll figure it out." He may also try to see his girlfriend when he leaves. He states that he is going home to get his belongings and then maybe sell his guitar to get some money. He states that he is trying to make a folder for himself of coping skills and things that he can learn from. He did not endorse SI or any thoughts of self harm.   Principal Problem: Bipolar II disorder, most recent episode major depressive (HCC) Diagnosis:   Patient Active Problem List   Diagnosis Date Noted  . Bipolar II disorder, most recent episode major depressive (HCC) [F31.81] 03/24/2018    Priority: High  . Noncompliance [Z91.19] 03/21/2018   Total Time spent with patient: 20 minutes  Past Psychiatric History: See H&P  Past Medical History:  Past Medical History:  Diagnosis Date  . Anxiety   . Bipolar 1 disorder, depressed (HCC) 03/21/2018  . Cannabis abuse 03/21/2018  . Depression    History reviewed. No pertinent surgical history. Family History: History reviewed. No pertinent family history. Family Psychiatric  History: See H&P Social History:  Social History   Substance and Sexual Activity  Alcohol Use Yes     Social History   Substance and Sexual Activity  Drug Use Yes  . Types: Marijuana   Comment: last use yesterday 03/19/18    Social History   Socioeconomic History  . Marital status: Single    Spouse name: Not on file  . Number of children: Not on file  . Years of  education: Not on file  . Highest education level: Not on file  Occupational History  . Not on file  Social Needs  . Financial resource strain: Not on file  . Food insecurity:    Worry: Not on file    Inability: Not on file  . Transportation needs:    Medical: Not on file    Non-medical: Not on file  Tobacco Use  . Smoking status: Current Every Day Smoker    Packs/day: 1.00    Types: Cigarettes  . Smokeless tobacco: Never Used  Substance and Sexual Activity  . Alcohol use: Yes  . Drug use: Yes    Types: Marijuana    Comment: last use yesterday 03/19/18  . Sexual activity: Not on file  Lifestyle  . Physical activity:    Days per week: Not on file    Minutes per session: Not on file  . Stress: Not on file  Relationships  . Social connections:    Talks on phone: Not on file    Gets together: Not on file    Attends religious service: Not on file    Active member of club or organization: Not on file    Attends meetings of clubs or organizations: Not on file    Relationship status: Not on file  Other Topics Concern  . Not on file  Social History Narrative  .  Not on file   Additional Social History:                         Sleep: Good  Appetite:  Good  Current Medications: Current Facility-Administered Medications  Medication Dose Route Frequency Provider Last Rate Last Dose  . acetaminophen (TYLENOL) tablet 650 mg  650 mg Oral Q6H PRN Clapacs, John T, MD      . alum & mag hydroxide-simeth (MAALOX/MYLANTA) 200-200-20 MG/5ML suspension 30 mL  30 mL Oral Q4H PRN Clapacs, John T, MD      . lurasidone (LATUDA) tablet 40 mg  40 mg Oral Q supper Clapacs, Jackquline Denmark, MD   40 mg at 03/24/18 1628  . magnesium hydroxide (MILK OF MAGNESIA) suspension 30 mL  30 mL Oral Daily PRN Clapacs, John T, MD      . nicotine (NICODERM CQ - dosed in mg/24 hours) patch 21 mg  21 mg Transdermal Daily Johany Hansman, Ileene Hutchinson, MD        Lab Results:  Results for orders placed or performed during  the hospital encounter of 03/23/18 (from the past 48 hour(s))  Hemoglobin A1c     Status: None   Collection Time: 03/24/18  6:52 AM  Result Value Ref Range   Hgb A1c MFr Bld 5.0 4.8 - 5.6 %    Comment: (NOTE) Pre diabetes:          5.7%-6.4% Diabetes:              >6.4% Glycemic control for   <7.0% adults with diabetes    Mean Plasma Glucose 96.8 mg/dL    Comment: Performed at [facility], [address] [place], [place] [address]  Lipid panel     Status: Abnormal   Collection Time: 03/24/18  6:52 AM  Result Value Ref Range   Cholesterol 156 0 - 200 mg/dL   Triglycerides 604 <540 mg/dL   HDL 39 (L) >98 mg/dL   Total CHOL/HDL Ratio 4.0 RATIO   VLDL 22 0 - 40 mg/dL   LDL Cholesterol 95 0 - 99 mg/dL    Comment:        Total Cholesterol/HDL:CHD Risk Coronary Heart Disease Risk Table                     Men   Women  1/2 Average Risk   3.4   3.3  Average Risk       5.0   4.4  2 X Average Risk   9.6   7.1  3 X Average Risk  23.4   11.0        Use the calculated Patient Ratio above and the CHD Risk Table to determine the patient's CHD Risk.        ATP III CLASSIFICATION (LDL):  <100     mg/dL   Optimal  119-147  mg/dL   Near or Above                    Optimal  130-159  mg/dL   Borderline  829-562  mg/dL   High  >130     mg/dL   Very High Performed at [facility], [address] [place], [place] [address]   TSH     Status: None   Collection Time: 03/24/18  6:52 AM  Result Value Ref Range   TSH 3.047 0.350 - 4.500 uIU/mL    Comment: Performed by a  3rd Generation assay with a functional sensitivity of <=0.01 uIU/mL. Performed at [facility], [address] [place], [place] [address]     Blood Alcohol level:  Lab Results  Component Value Date   [facility] <10 03/20/2018    Metabolic Disorder Labs: Lab Results  Component Value Date   HGBA1C 5.0 03/24/2018   MPG 96.8 03/24/2018   No results found for: PROLACTIN Lab Results  Component  Value Date   CHOL 156 03/24/2018   TRIG 108 03/24/2018   HDL 39 (L) 03/24/2018   CHOLHDL 4.0 03/24/2018   VLDL 22 03/24/2018   LDLCALC 95 03/24/2018    Physical Findings: AIMS: Facial and Oral Movements Muscles of Facial Expression: None, normal Lips and Perioral Area: None, normal Jaw: None, normal Tongue: None, normal,Extremity Movements Upper (arms, wrists, hands, fingers): None, normal Lower (legs, knees, ankles, toes): None, normal, Trunk Movements Neck, shoulders, hips: None, normal, Overall Severity Severity of abnormal movements (highest score from questions above): None, normal Incapacitation due to abnormal movements: None, normal Patient's awareness of abnormal movements (rate only patient's report): No Awareness, Dental Status Current problems with teeth and/or dentures?: No Does patient usually wear dentures?: No  CIWA:    COWS:     Musculoskeletal: Strength & Muscle Tone: within normal limits Gait & Station: normal Patient leans: N/A  Psychiatric Specialty Exam: Physical Exam  Nursing note and vitals reviewed.   Review of Systems  Gastrointestinal: Positive for diarrhea.  All other systems reviewed and are negative.   Blood pressure (!) 133/92, pulse 74, temperature 97.8 F (36.6 C), temperature source Oral, resp. rate 18, height  (1.6 m), weight 50.8 kg (112 lb), SpO2 99 %.Body mass index is 19.84 kg/m.  General Appearance: Casual  Eye Contact:  Good  Speech:  Clear and Coherent  Volume:  Normal  Mood:  Euthymic  Affect:  Appropriate  Thought Process:  Coherent and Goal Directed  Orientation:  Full (Time, Place, and Person)  Thought Content:  Logical  Suicidal Thoughts:  No  Homicidal Thoughts:  No  Memory:  Immediate;   Fair  Judgement:  Fair  Insight:  Fair  Psychomotor Activity:  Normal  Concentration:  Concentration: Fair  Recall:  [employer] of Knowledge:  Fair  Language:  Fair  Akathisia:  No      Assets:  Resilience  ADL's:   Intact  Cognition:  WNL  Sleep:  Number of Hours: 7     Treatment Plan Summary: 27 yo male admitted due to SI. He has not had any unsafe behaviors on the unit. He is very blame placing on others and does not take responsibility of his own actions. 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 HE does not appear manic or psychotic at all. He would like to discharge soon. He has continuously denied SI.   Plan:  Mood disorder -Continue Latuda 20 mg daily  Dispo -He will discharge tomorrow to self care  Haskell Riling, MD 03/25/2018, 2:44 PM

## 2018-03-25 NOTE — Progress Notes (Signed)
D: Patient stated slept good last night .Stated appetite is good and energy level  Is normal. Stated concentration is good . Stated on Depression scale , hopeless and anxiety .( low 0-10 high) Denies suicidal  homicidal ideations  .  No auditory hallucinations  No pain concerns . Appropriate ADL'S. Interacting with peers and staff.  Patient aware Of [facility] information . Emotional and mental health status  improved .  Information  given  in concrete form  attending  unit  programing . Voice no concerns around sleep. Staff working  with resources  ,for discharge  Compliant with medications  Denies suicidal ideations Able to talk with chaplin   about spiritual needs   A: Encourage patient participation with unit programming . Instruction  Given on  Medication , verbalize understanding.  R: Voice no other concerns. Staff continue to monitor

## 2018-03-25 NOTE — Plan of Care (Signed)
Patient aware Of [facility] information . Emotional and mental health status  improved .  Information  given  in concrete form  attending  unit  programing . Voice no concerns around sleep. Staff working  with resources  ,for discharge  Compliant with medications  Denies suicidal ideations Able to talk with chaplin   about spiritual needs    Problem: Education: Goal: Knowledge of [facility] Education information/materials will improve Outcome: Progressing Goal: Emotional status will improve Outcome: Progressing Goal: Mental status will improve Outcome: Progressing Goal: Verbalization of understanding the information provided will improve Outcome: Progressing

## 2018-03-25 NOTE — [facility] Group Notes (Signed)
CSW Group Therapy Note  03/25/2018  Time:  0900  Type of Therapy and Topic: Group Therapy: Goals Group: SMART Goals    Participation Level:  Active    Description of Group:   The purpose of a daily goals group is to assist and guide patients in setting recovery/wellness-related goals. The objective is to set goals as they relate to the crisis in which they were admitted. Patients will be using SMART goal modalities to set measurable goals. Characteristics of realistic goals will be discussed and patients will be assisted in setting and processing how one will reach their goal. Facilitator will also assist patients in applying interventions and coping skills learned in psycho-education groups to the SMART goal and process how one will achieve defined goal.    Therapeutic Goals:  -Patients will develop and document one goal related to or their crisis in which brought them into treatment.  -Patients will be guided by LCSW using SMART goal setting modality in how to set a measurable, attainable, realistic and time sensitive goal.  -Patients will process barriers in reaching goal.  -Patients will process interventions in how to overcome and successful in reaching goal.    Patient's Goal:  Pt attended group and was able to participate; however, pt was hyperverbal at times, and would attempt to speak for other patients. Pt was able to accept redirection from CSW. Pt reported he is feeling, "kind of good and kind of bad. I want to resolve what I need to do." Pt reported his goal for today is, "to have a calm conversation with my mom for at least twenty minutes by the end of the day."   Therapeutic Modalities:  Motivational Interviewing  Cognitive Behavioral Therapy  Crisis Intervention Model  SMART goals setting  Heidi Dach, MSW, LCSW Clinical Social Worker 03/25/2018 9:41 AM

## 2018-03-25 NOTE — Progress Notes (Signed)
Patient's narrative is similar to Kingsley Spittle' observation. Patient spoke about a conflict with another patient over religious differences (Paganism vs Christianity). Patient felt insulted and believes the staff does not view him on equal footing with his peers because of religious differences. The event occurred earlier in the day and by the time of the pastoral visit the patient reported that everything was "cool" and that he had "dabbed out" with the other patient. Chaplain questioned the rapid escalation to anger, the need to debate his beliefs, and his view that people are dumb and illiterate. Patient reported that people profile him as a "devil worshipper" and "possessed by demons" because they do not understand his faith.  Patient did not engage in a deeper conversation about the pain caused by labeling/profiling. Interestingly, the patient characterizes other patients in a negative light. Patient sought validation and mediation of the issue. Chaplain declined to mediate. Chaplain provided active listening and emotional support.

## 2018-03-25 NOTE — [facility] Group Notes (Signed)
03/25/2018 1PM  Type of Therapy/Topic:  Group Therapy:  Feelings about Diagnosis  Participation Level:  Active   Description of Group:   This group will allow patients to explore their thoughts and feelings about diagnoses they have received. Patients will be guided to explore their level of understanding and acceptance of these diagnoses. Facilitator will encourage patients to process their thoughts and feelings about the reactions of others to their diagnosis and will guide patients in identifying ways to discuss their diagnosis with significant others in their lives. This group will be process-oriented, with patients participating in exploration of their own experiences, giving and receiving support, and processing challenge from other group members.   Therapeutic Goals: 1. Patient will demonstrate understanding of diagnosis as evidenced by identifying two or more symptoms of the disorder 2. Patient will be able to express two feelings regarding the diagnosis 3. Patient will demonstrate their ability to communicate their needs through discussion and/or role play  Summary of Patient Progress: Actively and appropriately engaged in the group. Patient was able to provide support and validation to other group members. Patient was unable to practiced active listening when interacting with the facilitator and other group members as he would interrupt and continue to speak over others. 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  Patient in still in the process of obtaining treatment goals.        Therapeutic Modalities:   Cognitive Behavioral Therapy Brief Therapy Feelings Identification    Bill Shears, LCSW 03/25/2018 1:56 PM

## 2018-03-25 NOTE — Plan of Care (Signed)
Patient is adjusting gradually in the unit, adhering  to safety instructions, stating "I don't  want to get into trouble " patient is participating in groups and assertive with peers, sleep long hours and aware of coping skills, encouraged to follow his medication regimen, has tendency to refuse some of his medications, denies any SI/HI and no signs of AVH at this time. 15 minute safety rounds is maintained no distress noted.. Problem: Education: Goal: Knowledge of  General Education information/materials will improve Outcome: Progressing Goal: Emotional status will improve Outcome: Progressing Goal: Mental status will improve Outcome: Progressing Goal: Verbalization of understanding the information provided will improve Outcome: Progressing   Problem: Activity: Goal: Interest or engagement in activities will improve Outcome: Progressing Goal: Sleeping patterns will improve Outcome: Progressing   Problem: Health Behavior/Discharge Planning: Goal: Identification of resources available to assist in meeting health care needs will improve Outcome: Progressing Goal: Compliance with treatment plan for underlying cause of condition will improve Outcome: Progressing   Problem: Education: Goal: Ability to make informed decisions regarding treatment will improve Outcome: Progressing   Problem: Medication: Goal: Compliance with prescribed medication regimen will improve Outcome: Progressing   Problem: Self-Concept: Goal: Ability to disclose and discuss suicidal ideas will improve Outcome: Progressing Goal: Will verbalize positive feelings about self Outcome: Progressing   Problem: Spiritual Needs Goal: Ability to function at adequate level Outcome: Progressing   Problem: Safety: Goal: Ability to remain free from injury will improve Outcome: Progressing

## 2018-03-25 NOTE — Progress Notes (Signed)
CSW spoke with pt's mother, Kaitlyn Skowron, who reported pt is still not able to return home upon discharge. Pt's mother stated she plans to take a restraining order out against patient today (03/25/18), and plans to drop his clothing off prior to taking out the restraining order. CSW expressed understanding and informed pt's mother that pt will likely be discharged on Wednesday or Thursday. Pt's mother expressed understanding and agreement with this information.   CSW contacted Gerlean Ren at [facility] to inquire if pt was able to stay there. Gerlean Ren reported pt was not able to come to the shelter as pt is a registered sex offender. CSW asked if Gerlean Ren was aware of any other resources for registered sex  offenders. Gerlean Ren stated he was not. CSW will continue to coordinate with [facility] as needed.   Pt reported he is not able to stay with his grandmother in [place], [place] due to being on the sex offender registry, and needing to alert them of a change in address in advance. Pt reports he has no other family members who would be willing to provide shelter.   CSW spoke with her colleagues, who provided a list of resources for registered sex offenders. CSW will print this resource and provide it to pt.   Heidi Dach, MSW, LCSW Clinical Social Worker 03/25/2018 10:02 AM

## 2018-03-25 NOTE — Progress Notes (Addendum)
Recreation Therapy Notes   Date: 03/25/2018  Time: 9:30 am  Location: Craft Room  Behavioral response: Confrontational   Intervention Topic: Communication  Discussion/Intervention:  Group content today was focused on communication. The group defined communication and ways to communicate with others. Individuals stated reason why communication is important and some reasons to communicate with others. Patients expressed if they thought they were good at communicating with others and ways they could improve their communication skills. The group identified important parts of communication and some experiences they have had in the past with communication. The group participated in the intervention "What is that?", where they had a chance to test out their communication skills and identify ways to improve their communication techniques.  Clinical Observations/Feedback:  Patient came to group and stated communication is when there is a mutual interaction between two people. He stated communication can be verbal and nonverbal. Individual participated in the intervention but became confrontational with another peer.He was redirected by staff to stay on topic and focus on himself.  Joanna Hall LRT/CTRS          Rohn Fritsch 03/25/2018 11:48 AM

## 2018-03-26 MED ORDER — LURASIDONE HCL 40 MG PO TABS: 40.0000 mg | ORAL_TABLET | Freq: Every day | ORAL | 0 refills | Status: DC

## 2018-03-26 MED ORDER — LURASIDONE HCL 40 MG PO TABS: 40.0000 mg | ORAL_TABLET | Freq: Every day | ORAL | 0 refills | Status: AC

## 2018-03-26 NOTE — [facility] Suicide Risk Assessment (Signed)
[facility] Discharge Suicide Risk Assessment   Principal Problem: Bipolar II disorder, most recent episode major depressive [facility]) Discharge Diagnoses:  Patient Active Problem List   Diagnosis Date Noted  . Bipolar II disorder, most recent episode major depressive (HCC) [F31.81] 03/24/2018    Priority: High  . Noncompliance [Z91.19] 03/21/2018    Mental Status Per Nursing Assessment::   On Admission:     Demographic Factors:  Male, Caucasian and Unemployed  Loss Factors: Legal issues  Historical Factors: Impulsivity  Risk Reduction Factors:   Positive coping skills or problem solving skills  Continued Clinical Symptoms:  Personality Disorders:   Cluster B  Cognitive Features That Contribute To Risk:  None    Suicide Risk:  Minimal: No identifiable suicidal ideation.   Follow-up Information    [facility], [facility]. Go on 03/28/2018.   Why:  Please go to your hospital follow up appointment on Friday, 03/28/18 at 9AM. Thank you! Contact information: [address] [place] [address] [phone]           Plan Of Care/Follow-up recommendations: Alessandra Bevels, MD 03/26/2018, 8:46 AM

## 2018-03-26 NOTE — Progress Notes (Signed)
  [facility] Adult Case Management Discharge Plan :  Will you be returning to the same living situation after discharge:  No. At discharge, do you have transportation home?: Yes,  LINK Do you have the ability to pay for your medications: Yes,  [facility]  Release of information consent forms completed and in the chart;  Patient's signature needed at discharge.  Patient to Follow up at: Follow-up Information    [facility], [facility]. Go on 03/28/2018.   Why:  Please go to your hospital follow up appointment on Friday, 03/28/18 at 9AM. Thank you! Contact information: [address] [place] [address] [phone]           Next level of care provider has access to [facility] Link:no  Safety Planning and Suicide Prevention discussed: Yes,  with pt's mother.  Have you used any form of tobacco in the last 30 days? (Cigarettes, Smokeless Tobacco, Cigars, and/or Pipes): Yes  Has patient been referred to the Quitline?: Patient refused referral  Patient has been referred for addiction treatment: N/A  Heidi Dach, LCSW 03/26/2018, 8:53 AM

## 2018-03-26 NOTE — Progress Notes (Signed)
Patient denies any acute pain as well as HI/AVH. Patient has not voiced any concern. Resting quietly at this time with his eyes closed, respirations even and unlabored. Will continue to monitor every 15 minutes.

## 2018-03-26 NOTE — Progress Notes (Signed)
D: Patient is aware of  Discharge this shift .Patient denies suicidal /homicidal ideations. Patient received all belongings brought in  A: No Storage medications. Writer reviewed Discharge Summary, Suicide Risk Assessment, and Transitional Record. Patient also received Prescriptions   from  MD.  R: Patient left unit with no questions  Or concerns  With public transit

## 2018-03-26 NOTE — Progress Notes (Signed)
Patient slept 7.45  hours. No issues.

## 2018-03-26 NOTE — Progress Notes (Signed)
Recreation Therapy Notes  INPATIENT RECREATION TR PLAN  Patient Details Name: Bill Anderson MRN: [id number] DOB: [birth year] Today's Date: 03/26/2018  Rec Therapy Plan Is patient appropriate for Therapeutic Recreation?: Yes Treatment times per week: at least 3 Estimated Length of Stay: 5-7 days TR Treatment/Interventions: Group participation (Comment)  Discharge Criteria Pt will be discharged from therapy if:: Discharged Treatment plan/goals/alternatives discussed and agreed upon by:: Patient/family  Discharge Summary Short term goals set: Patient will identify benefit of making healthy decisions post d/c within 5 recreation therapy group sessions Short term goals met: Complete Progress toward goals comments: Groups attended Which groups?: Communication, Coping skills Reason goals not met: N/A Therapeutic equipment acquired: N/A Reason patient discharged from therapy: Discharge from hospital Pt/family agrees with progress & goals achieved: Yes Date patient discharged from therapy: 03/26/18   Julieta Rogalski 03/26/2018, 3:15 PM

## 2018-03-26 NOTE — Discharge Summary (Signed)
Physician Discharge Summary Note  Patient:  Bill Anderson is an 27 y.o., male MRN:  [id number] DOB:  [birth year] Patient phone:  [phone] (home)  Patient address:   [address] [place] [address],  Total Time spent with patient: 15 minutes  Plus 20 minutes of medication reconciliation, discharge planning, and discharge documentation   Date of Admission:  03/23/2018 Date of Discharge: 03/26/18  Reason for Admission:  Suicidal gesture  Principal Problem: Bipolar II disorder, most recent episode major depressive [facility]) Discharge Diagnoses: Patient Active Problem List   Diagnosis Date Noted  . Bipolar II disorder, most recent episode major depressive (HCC) [F31.81] 03/24/2018    Priority: High  . Noncompliance [Z91.19] 03/21/2018    Past Psychiatric History: See H&P  Past Medical History:  Past Medical History:  Diagnosis Date  . Anxiety   . Bipolar 1 disorder, depressed (HCC) 03/21/2018  . Cannabis abuse 03/21/2018  . Depression    History reviewed. No pertinent surgical history. Family History: History reviewed. No pertinent family history. Family Psychiatric  History: See H&P Social History:  Social History   Substance and Sexual Activity  Alcohol Use Yes     Social History   Substance and Sexual Activity  Drug Use Yes  . Types: Marijuana   Comment: last use yesterday 03/19/18    Social History   Socioeconomic History  . Marital status: Single    Spouse name: Not on file  . Number of children: Not on file  . Years of education: Not on file  . Highest education level: Not on file  Occupational History  . Not on file  Social Needs  . Financial resource strain: Not on file  . Food insecurity:    Worry: Not on file    Inability: Not on file  . Transportation needs:    Medical: Not on file    Non-medical: Not on file  Tobacco Use  . Smoking status: Current Every Day Smoker    Packs/day: 1.00    Types: Cigarettes  . Smokeless tobacco:  Never Used  Substance and Sexual Activity  . Alcohol use: Yes  . Drug use: Yes    Types: Marijuana    Comment: last use yesterday 03/19/18  . Sexual activity: Not on file  Lifestyle  . Physical activity:    Days per week: Not on file    Minutes per session: Not on file  . Stress: Not on file  Relationships  . Social connections:    Talks on phone: Not on file    Gets together: Not on file    Attends religious service: Not on file    Active member of club or organization: Not on file    Attends meetings of clubs or organizations: Not on file    Relationship status: Not on file  Other Topics Concern  . Not on file  Social History Narrative  . Not on file    Hospital Course:  Pt was restarted on Latuda which was effective for him in the past. He tolerated it well. He did not have any unsafe behaviors on the unit. He did not appear manic or psychotic during hospitalization. He attended groups and interacted well with peers. On day of discharge, he had bright affect. He was socializing with peers. He planned to go to his house with a sheriff to get his belongings. He states that his friend Florentina Addison may allow him to stay with her so he will sort this out.  It is difficult because he is not able to go to certain shelters due to being on the sex offender registry. Pt continuously denied SI or any thoughts of self harm. Denied HI, Ah, VH. He requested bus pass to get around [place]. He was offered 7 day supply of medications. However, he states that he wont' likely take it and "smoke weed instead." However, he did state that he will go to [place] and talk about medications on Friday.   The patient is at low risk of imminent suicide. Patient denied thoughts, intent, or plan for harm to self or others, expressed significant future orientation, and expressed an ability to mobilize assistance for his needs. He is presently void of any contributing psychiatric symptoms, cognitive difficulties, or  substance use which would elevate his risk for lethality. Chronic risk for lethality is elevated in light of male gender, impulsivity, extensive cluster B traits.  The chronic risk is presently mitigated by his ongoing desire and engagement in [facility] treatment and mobilization of support from family and friends. Chronic risk may elevate if he experiences any significant loss or worsening of symptoms, which can be managed and monitored through outpatient providers. At this time,a cute risk for lethality is low and he is stable for ongoing outpatient management.   Modifiable risk factors were addressed during this hospitalization through appropriate pharmacotherapy and establishment of outpatient follow-up treatment. Some risk factors for suicide are situational (i.e. Unstable housing) or related personality pathology (i.e. Poor coping mechanisms) and thus cannot be further mitigated by continued hospitalization in this setting.    Physical Findings: AIMS: Facial and Oral Movements Muscles of Facial Expression: None, normal Lips and Perioral Area: None, normal Jaw: None, normal Tongue: None, normal,Extremity Movements Upper (arms, wrists, hands, fingers): None, normal Lower (legs, knees, ankles, toes): None, normal, Trunk Movements Neck, shoulders, hips: None, normal, Overall Severity Severity of abnormal movements (highest score from questions above): None, normal Incapacitation due to abnormal movements: None, normal Patient's awareness of abnormal movements (rate only patient's report): No Awareness, Dental Status Current problems with teeth and/or dentures?: No Does patient usually wear dentures?: No  CIWA:    COWS:     Musculoskeletal: Strength & Muscle Tone: within normal limits Gait & Station: Normal Patient leans: N/A  Psychiatric Specialty Exam: Physical Exam  Nursing note and vitals reviewed.   Review of Systems  All other systems reviewed and are negative.   Blood pressure (!)  129/97, pulse (!) 102, temperature 97.9 F (36.6 C), temperature source Oral, resp. rate 18, height  (1.6 m), weight 50.8 kg (112 lb), SpO2 98 %.Body mass index is 19.84 kg/m.  General Appearance: Casual  Eye Contact:  Good  Speech:  Clear and Coherent  Volume:  Normal  Mood:  Euthymic  Affect:  Appropriate  Thought Process:  Coherent and Goal Directed  Orientation:  Full (Time, Place, and Person)  Thought Content:  Logical  Suicidal Thoughts:  No  Homicidal Thoughts:  No  Memory:  Immediate;   Fair  Judgement:  Impaired  Insight:  Lacking  Psychomotor Activity:  Normal  Concentration:  Concentration: Fair  Recall:  [employer] of Knowledge:  Fair  Language:  Fair  Akathisia:  No      Assets:  Resilience  ADL's:  Intact  Cognition:  WNL  Sleep:  Number of Hours: 7     Have you used any form of tobacco in the last 30 days? (Cigarettes, Smokeless Tobacco, Cigars, and/or Pipes): Yes  Has this patient used any form of tobacco in the last 30 days? (Cigarettes, Smokeless Tobacco, Cigars, and/or Pipes) Yes, Yes, A prescription for an FDA-approved tobacco cessation medication was offered at discharge and the patient refused  Blood Alcohol level:  Lab Results  Component Value Date   ETH <10 03/20/2018    Metabolic Disorder Labs:  Lab Results  Component Value Date   HGBA1C 5.0 03/24/2018   MPG 96.8 03/24/2018   No results found for: PROLACTIN Lab Results  Component Value Date   CHOL 156 03/24/2018   TRIG 108 03/24/2018   HDL 39 (L) 03/24/2018   CHOLHDL 4.0 03/24/2018   VLDL 22 03/24/2018   LDLCALC 95 03/24/2018    See Psychiatric Specialty Exam and Suicide Risk Assessment completed by Attending Physician prior to discharge.  Discharge destination:  Other:  [facility]  Is patient on multiple antipsychotic therapies at discharge:  No   Has Patient had three or more failed trials of antipsychotic monotherapy by history:  No  Recommended Plan for Multiple  Antipsychotic Therapies: NA   Allergies as of 03/26/2018   No Known Allergies     Medication List    TAKE these medications     Indication  lurasidone 40 MG Tabs tablet Commonly known as:  LATUDA Take 1 tablet (40 mg total) by mouth daily with supper.  Indication:  Mood stabilizer      Follow-up Information    P[facility], [facility]. Go on 03/28/2018.   Why:  Please go to your hospital follow up appointment on Friday, 03/28/18 at 9AM. Thank you! Contact information: [address] [place] [address] [phone]           Follow-up recommendations:  Follow up with [facility] Signed: Haskell Riling, MD 03/26/2018, 8:47 AM

## 2020-08-11 NOTE — ED Provider Notes (Signed)
 [facility] Emergency Department Provider Note  Provider at Bedside: 08/11/2020 12:59 PM  History   Chief Complaint  Patient presents with  . Laceration     History of Present Illness  History obtained from:Patient  Bill Anderson is a 29 y.o. male with a complaint of crush type finger injury that occurred just prior to arrival. Patient reports his fingers on the right hand were accidentally pinned between forklift prongs. He sustained laceration to the end of his right middle finger. He describes his pain as a throbbing sensation with decreased sensation. Patient is unable to move the end of his left middle finger. Patient states his last tetanus was <5 years ago. Patient denies any other injuries. Patient is right hand dominant. Patient has not taken any medication for his symptoms.   Past Medical History Past Medical History:  Diagnosis Date  . Depression     Past Surgical History History reviewed. No pertinent surgical history.  Current Medications Home Medications  Medication Sig Dispense Refill  . cephalexin (KEFLEX) 500 MG capsule Take 1 capsule (500 mg total) by mouth 3 times daily for 7 days. 21 capsule 0    Allergies No Known Allergies  Family History No family history on file.  Social History Social History   Tobacco Use  . Smoking status: Not on file  Substance Use Topics  . Alcohol use: Not on file  . Drug use: Not on file    Review of Systems   Review of Systems  Constitutional: Negative for fever.  Respiratory: Negative for shortness of breath.   Cardiovascular: Negative for chest pain.  Gastrointestinal: Negative for nausea and vomiting.  Musculoskeletal: Positive for arthralgias. Negative for joint swelling and myalgias.  Skin: Positive for wound. Negative for color change and rash.  Allergic/Immunologic: Negative for immunocompromised state.  Neurological: Positive for numbness. Negative for weakness.  Hematological: Does  not bruise/bleed easily.  Psychiatric/Behavioral: Negative for self-injury.    Physical Exam   BP 140/87   Pulse 78   Temp 98.1 F (36.7 C) (Oral)   Resp 16   Ht 1.6 m (5' 3)   Wt 52.2 kg (115 lb)   SpO2 96%   BMI 20.37 kg/m   Physical Exam Vitals and nursing note reviewed.  Constitutional:      General: He is not in acute distress.    Appearance: He is well-developed. He is not toxic-appearing.  Cardiovascular:     Rate and Rhythm: Normal rate and regular rhythm.     Heart sounds: Normal heart sounds. No murmur heard.   Pulmonary:     Effort: Pulmonary effort is normal. No respiratory distress.     Breath sounds: Normal breath sounds. No wheezing or rales.  Chest:     Chest wall: No tenderness.  Musculoskeletal:     Right hand: Laceration (1cm laceration to the pad of his right middle finger) and tenderness (distal middle finger) present. Decreased range of motion (secondary to pain). Normal capillary refill. Normal pulse.     Left hand: Normal.     Comments: Subungal hematoma to the distal nail of left middle finger. 0.5cm hematoma to the distal right ring finger  Skin:    General: Skin is warm and dry.  Neurological:     Mental Status: He is alert and oriented to person, place, and time.     Motor: No abnormal muscle tone.  Psychiatric:        Behavior: Behavior normal.  Radiology   Results for orders placed or performed during the hospital encounter of 08/11/20  XR FINGERS RIGHT (SPECIFY DIGITS)   Narrative   CLINICAL DATA:  Crush injury third finger  EXAM: RIGHT FINGER(S) - 2+ VIEW  COMPARISON:  None.  FINDINGS: Fracture of the tuft of the distal third phalanx with mild displacement. Overlying soft tissue swelling. No foreign body.  IMPRESSION: Fracture of the tuft of the distal third phalanx.   Electronically Signed   By: Carlin Gaskins M.D.   On: 08/11/2020 13:30      Procedures   Lac Repair  Date/Time: 08/11/2020 1:55 PM Performed  by: Mliss Golda Brady, PA-C Authorized by: Norleen Ozell Towana PONCE, MD  Consent: Verbal consent obtained. Risks and benefits: risks, benefits and alternatives were discussed Consent given by: patient Patient understanding: patient states understanding of the procedure being performed Patient identity confirmed: verbally with patient Body area: upper extremity Location details: right long finger Laceration length: 1 cm Anesthesia: local infiltration  Anesthesia: Local Anesthetic: lidocaine 1% without epinephrine Preparation: Patient was prepped and draped in the usual sterile fashion. Irrigation solution: saline Irrigation method: syringe Amount of cleaning: extensive Wound skin closure material used: 5-0 Ethilon. Number of sutures: 3 Technique: simple Approximation: close Approximation difficulty: simple Dressing: 4x4 sterile gauze Patient tolerance: patient tolerated the procedure well with no immediate complications     ED Course      08/11/2020 2:01 PM - Discussed findings and plan of care with patient and/or family. Patient/family are in agreement with plan. Patient advised to follow up with orthopedics or to return for any worsening symptoms. Patient discharged with prescriptions for keflex.   Medical Decision Making   MDM Number of Diagnoses or Management Options Closed fracture of tuft of distal phalanx of finger Crushing injury of right middle finger, initial encounter Laceration of right middle finger without foreign body without damage to nail, initial encounter Diagnosis management comments: 29 year old male presenting with a crush injury to the right middle finger onset today when his finger was pinned between forklift prongs.  He is afebrile see vital signs.  He sustained a 1 cm full-thickness laceration the distal right middle finger with associated bony tenderness and subungual hematoma.  He is decreased sensation to the distal finger however cap refill is  intact.  X-ray the finger shows fracture of the tuft of the distal third phalanx.  Given the overlying laceration is concerning for an open fracture and he was therefore given IM Ancef for prophylaxis.  Wound was thoroughly cleaned and repaired with sutures.  Bacitracin and sterile dressing was applied to the finger. I ordered a splint to be placed on his finger however he refused.  He was prescribed Keflex and referred to orthopedics for follow-up.  DDX: laceration, abrasion, nerve damage, tendon damage, vascular injury, fracture, dislocation.    Amount and/or Complexity of Data Reviewed Discussion of test results with the performing providers: no Decide to obtain previous medical records or to obtain history from someone other than the patient: no Obtain history from someone other than the patient: no Discuss the patient with other providers: no   Clinical Impression, Assessment and Plan   Diagnosis 1. Crushing injury of right middle finger, initial encounter   2. Closed fracture of tuft of distal phalanx of finger   3. Laceration of right middle finger without foreign body without damage to nail, initial encounter     ED Medications Given During Visit Medications  ceFAZolin (ANCEF) injection 1 g (  has no administration in time range)  water for injection, sterile (PF) Solution (has no administration in time range)  lidocaine (XYLOCAINE) 10 mg/mL (1 %) injection 10 mL (10 mLs Infiltration Given 08/11/20 1329)  bacitracin ointment ( Topical Given 08/11/20 1329)    Medications Prescribed New Prescriptions   CEPHALEXIN (KEFLEX) 500 MG CAPSULE    Take 1 capsule (500 mg total) by mouth 3 times daily for 7 days.    Follow-up Ozell Norleen Rigg, MD [address] [place] [address] [phone]  Schedule an appointment as soon as possible for a visit  Orthopedics - open left finger tuft fracture  Emergency - [place], [facility] [address] [place]  [place]  [address] [phone] Go to  As needed, If symptoms worsen   __________________________________________________________ This document serves as a record of services personally performed by Mliss Brady, PA-C. It was created on their behalf by Doss Mania, Medical Scribe, a trained medical scribe. The creation of this record is the provider's dictation and/or activities during the visit.   Electronically signed by: Doss Mania, Medical Scribe 08/11/2020 12:59 PM   I agree the documentation is accurate and complete.  Electronically signed by: Mliss Brady, PA-C 08/11/2020 2:03 PM      Electronically signed by: Mliss Golda Brady, PA-C 08/11/20 1415    Electronically signed by: Mliss Golda Brady, PA-C 08/11/20 1426

## 2022-04-22 ENCOUNTER — Emergency Department
Admission: EM | Admit: 2022-04-22 | Discharge: 2022-04-22 | Disposition: A | Discharge status: Home / Self Care | Payer: Self-pay | Attending: Emergency Medicine | Admitting: Emergency Medicine

## 2022-04-22 ENCOUNTER — Emergency Department: Payer: Self-pay

## 2022-04-22 DIAGNOSIS — N50812 Left testicular pain: Secondary | ICD-10-CM | POA: Insufficient documentation

## 2022-04-22 DIAGNOSIS — N5082 Scrotal pain: Secondary | ICD-10-CM | POA: Insufficient documentation

## 2022-04-22 LAB — URINALYSIS, ROUTINE W REFLEX MICROSCOPIC
Bilirubin Urine: NEGATIVE
Glucose, UA: NEGATIVE mg/dL
Ketones, ur: NEGATIVE mg/dL
Leukocytes,Ua: NEGATIVE
Nitrite: NEGATIVE
Protein, ur: NEGATIVE mg/dL
Specific Gravity, Urine: 1.025 (ref 1.005–1.030)
Squamous Epithelial / HPF: NONE SEEN (ref 0–5)
pH: 6 (ref 5.0–8.0)

## 2022-04-22 LAB — CBC WITH DIFFERENTIAL/PLATELET
Abs Immature Granulocytes: 0.03 10*3/uL (ref 0.00–0.07)
Basophils Absolute: 0.1 10*3/uL (ref 0.0–0.1)
Basophils Relative: 1 %
Eosinophils Absolute: 0.2 10*3/uL (ref 0.0–0.5)
Eosinophils Relative: 2 %
HCT: 44.7 % (ref 39.0–52.0)
Hemoglobin: 14.7 g/dL (ref 13.0–17.0)
Immature Granulocytes: 0 %
Lymphocytes Relative: 24 %
Lymphs Abs: 2.3 10*3/uL (ref 0.7–4.0)
MCH: 29.6 pg (ref 26.0–34.0)
MCHC: 32.9 g/dL (ref 30.0–36.0)
MCV: 90.1 fL (ref 80.0–100.0)
Monocytes Absolute: 0.4 10*3/uL (ref 0.1–1.0)
Monocytes Relative: 5 %
Neutro Abs: 6.4 10*3/uL (ref 1.7–7.7)
Neutrophils Relative %: 68 %
Platelets: 225 10*3/uL (ref 150–400)
RBC: 4.96 MIL/uL (ref 4.22–5.81)
RDW: 11.9 % (ref 11.5–15.5)
WBC: 9.4 10*3/uL (ref 4.0–10.5)
nRBC: 0 % (ref 0.0–0.2)

## 2022-04-22 LAB — COMPREHENSIVE METABOLIC PANEL
ALT: 10 U/L (ref 0–44)
AST: 18 U/L (ref 15–41)
Albumin: 4.2 g/dL (ref 3.5–5.0)
Alkaline Phosphatase: 73 U/L (ref 38–126)
Anion gap: 5 (ref 5–15)
BUN: 11 mg/dL (ref 6–20)
CO2: 24 mmol/L (ref 22–32)
Calcium: 9.1 mg/dL (ref 8.9–10.3)
Chloride: 109 mmol/L (ref 98–111)
Creatinine, Ser: 0.66 mg/dL (ref 0.61–1.24)
GFR, Estimated: >60 mL/min (ref >60)
Glucose, Bld: 103 mg/dL — ABNORMAL HIGH (ref 70–99)
Potassium: 4 mmol/L (ref 3.5–5.1)
Sodium: 138 mmol/L (ref 135–145)
Total Bilirubin: 1.1 mg/dL (ref 0.3–1.2)
Total Protein: 7.3 g/dL (ref 6.5–8.1)

## 2022-04-22 MED ORDER — KETOROLAC TROMETHAMINE 30 MG/ML IJ SOLN: 30.0000 mg | Freq: Once | INTRAMUSCULAR | Status: AC

## 2022-04-22 MED ORDER — SODIUM CHLORIDE 0.9 % IV BOLUS
500.0000 mL | Freq: Once | INTRAVENOUS | Status: AC
  Administered 2022-04-22: 500 mL via INTRAVENOUS

## 2022-04-22 MED ORDER — KETOROLAC TROMETHAMINE 30 MG/ML IJ SOLN
INTRAMUSCULAR | Status: AC
  Administered 2022-04-22: 30 mg via INTRAVENOUS
  Filled 2022-04-22: qty 1

## 2022-04-22 MED ORDER — NAPROXEN 500 MG PO TABS: 500.0000 mg | ORAL_TABLET | Freq: Two times a day (BID) | ORAL | 2 refills | Status: AC

## 2022-04-22 NOTE — ED Notes (Signed)
This RN at bedside to [facility] pt's fluids, pt asking if he could go out an take a cigarette break and explained that we could not and that if he left he would have to start the entire process over. Offered pt a nicotine patch. Pt refused.

## 2022-04-22 NOTE — ED Triage Notes (Signed)
Patient to ER via [facility]. Patient currently homeless. Patient with complaints of left testicle pain.   Reports that he was masturbating and his left testicle became "purple" and swollen yesterday. Denies any urinary symptoms.

## 2022-04-22 NOTE — ED Notes (Signed)
US at bedside

## 2022-04-22 NOTE — ED Provider Notes (Signed)
[facility] Provider Note    Event Date/Time   First MD Initiated Contact with Patient 04/22/22 1116     (approximate)   History   Testicle Pain   HPI  Bill Anderson is a 31 y.o. male with history of cannabis abuse, bipolar disorder, depression who presents with complaints of left testicular pain.  Patient reports he noticed this last night, while masturbating.  He thinks that the pain started before but he only noticed it then.  Denies swelling or injury.  No fevers chills.  No dysuria.  No penile discharge     Physical Exam   Triage Vital Signs: ED Triage Vitals  Enc Vitals Group     BP 04/22/22 1119 (!) 157/99     Pulse Rate 04/22/22 1119 61     Resp 04/22/22 1119 18     Temp 04/22/22 1117 98.5 F (36.9 C)     Temp Source 04/22/22 1117 Oral     SpO2 04/22/22 1119 100 %     Weight --      Height 04/22/22 1118 1.6 m (5\' 3" )     Head Circumference --      Peak Flow --      Pain Score 04/22/22 1117 6     Pain Loc --      Pain Edu? --      Excl. in [place]? --     Most recent vital signs: Vitals:   04/22/22 1119 04/22/22 1326  BP: (!) 157/99 (!) 157/99  Pulse: 61 62  Resp: 18 17  Temp:  98.5 F (36.9 C)  SpO2: 100% 100%     General: Awake, no distress.  CV:  Good peripheral perfusion.  Resp:  Normal effort.  Abd:  No distention.  Other:  GU: Mild tenderness along the left lateral testicular border, no significant swelling, no discharge   ED Results / Procedures / Treatments   Labs (all labs ordered are listed, but only abnormal results are displayed) Labs Reviewed  URINALYSIS, ROUTINE W REFLEX MICROSCOPIC - Abnormal; Notable for the following components:      Result Value   APPearance CLEAR (*)    Hgb urine dipstick TRACE (*)    Bacteria, UA RARE (*)    All other components within normal limits  COMPREHENSIVE METABOLIC PANEL - Abnormal; Notable for the following components:   Glucose, Bld 103 (*)    All other components  within normal limits  CBC WITH DIFFERENTIAL/PLATELET     EKG     RADIOLOGY Ultrasound scrotum viewed by me, no abnormality noted, pending radiology review    PROCEDURES:  Critical Care performed:   Procedures   MEDICATIONS ORDERED IN ED: Medications  sodium chloride 0.9 % bolus 500 mL (0 mLs Intravenous Stopped 04/22/22 1310)  ketorolac (TORADOL) 30 MG/ML injection 30 mg (30 mg Intravenous Given 04/22/22 1206)     IMPRESSION / MDM / ASSESSMENT AND PLAN / ED COURSE  I reviewed the triage vital signs and the nursing notes. Patient's presentation is most consistent with acute presentation with potential threat to life or bodily function.  Patient presents with left scrotal pain as detailed above.  Differential includes torsion, epididymitis, STI, trauma  Exam is overall not consistent with trauma or external infection.  Possibility of torsion/epididymitis  Pending ultrasound scrotum, urinalysis, labs, will treat with IV Toradol  No acute abnormality on ultrasound.  Urinalysis is reassuring, CBC CBC BMP are unremarkable  Patient improved after IV Toradol, possibility  of early epididymitis recommend to treat with NSAIDs, return precautions discussed      FINAL CLINICAL IMPRESSION(S) / ED DIAGNOSES   Final diagnoses:  Testicular pain, left     Rx / DC Orders   ED Discharge Orders          Ordered    naproxen (NAPROSYN) 500 MG tablet  2 times daily with meals        04/22/22 1310             Note:  This document was prepared using Dragon voice recognition software and may include unintentional dictation errors.   Jene Every, MD 04/22/22 1401

## 2022-12-17 IMAGING — US US SCROTUM W/ DOPPLER COMPLETE
2 of 3 series · 13 of 25 positions shown · non-contrast
Comparison: None Available.

CLINICAL DATA: Scrotal pain.

EXAM:
SCROTAL ULTRASOUND
DOPPLER ULTRASOUND OF THE TESTICLES
TECHNIQUE: Complete ultrasound examination of the testicles, epididymis, and
other scrotal structures was performed. Color and spectral Doppler
ultrasound were also utilized to evaluate blood flow to the
testicles.

[Series 1: us scrotum w/doppler · 12 of 32 slices shown (1 of 2)]
[im 1/32]
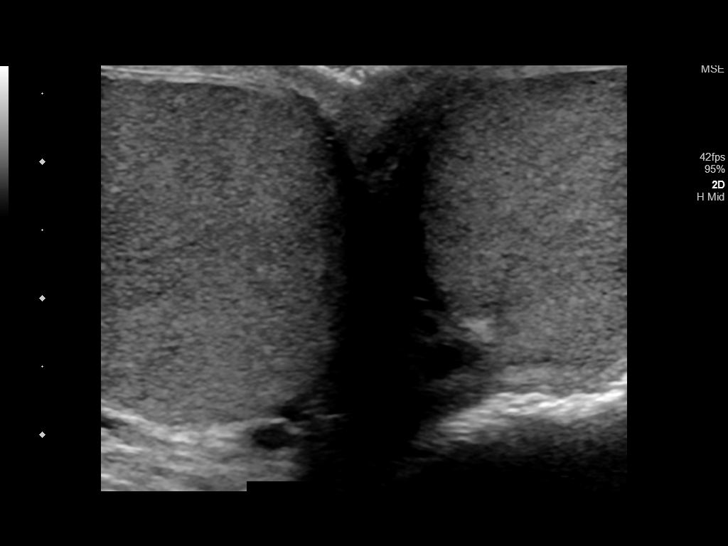
[im 3/32]
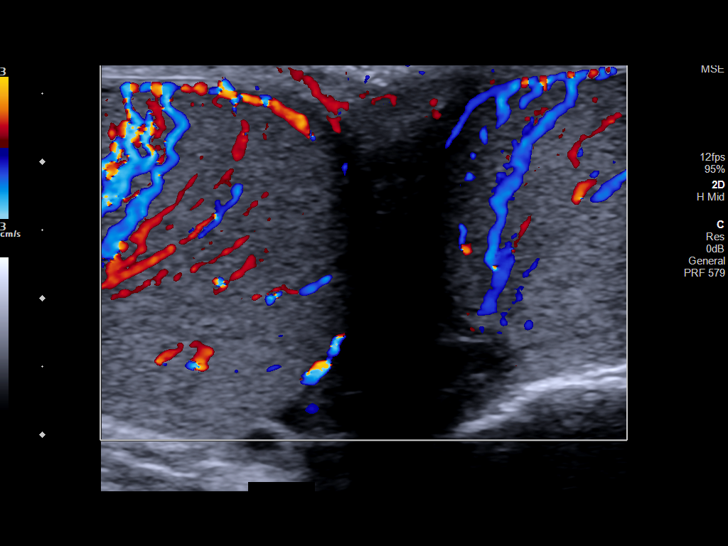
[im 6/32]
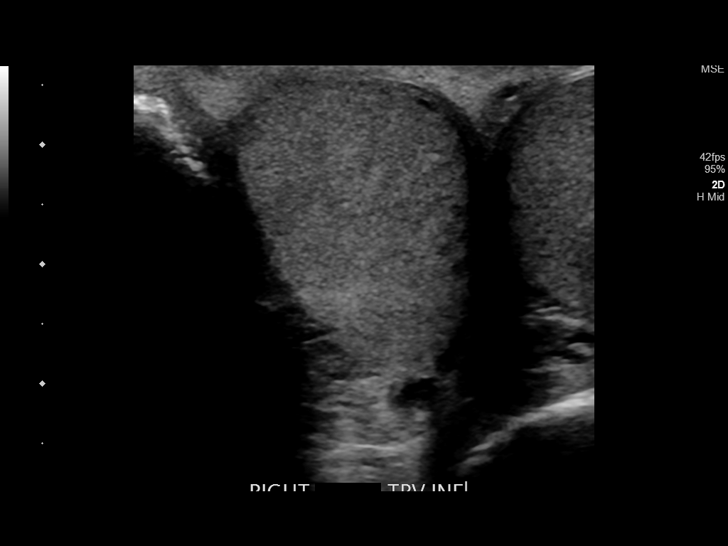
[im 9/32]
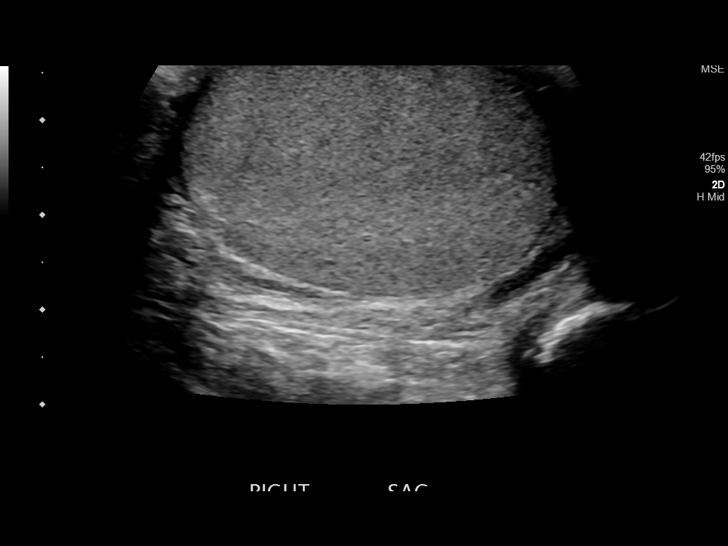
[im 12/32]
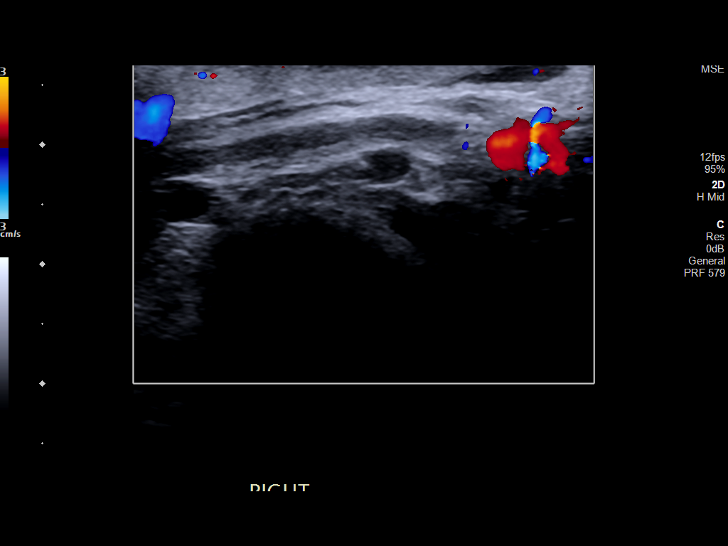
[im 15/32]
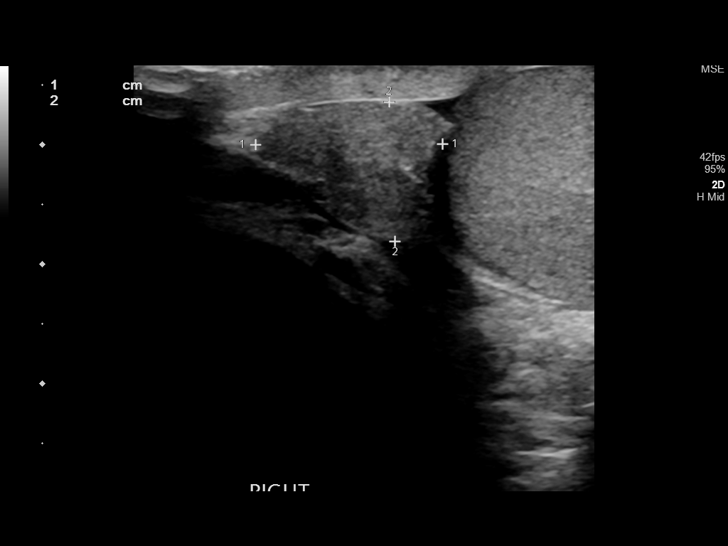
[im 17/32]
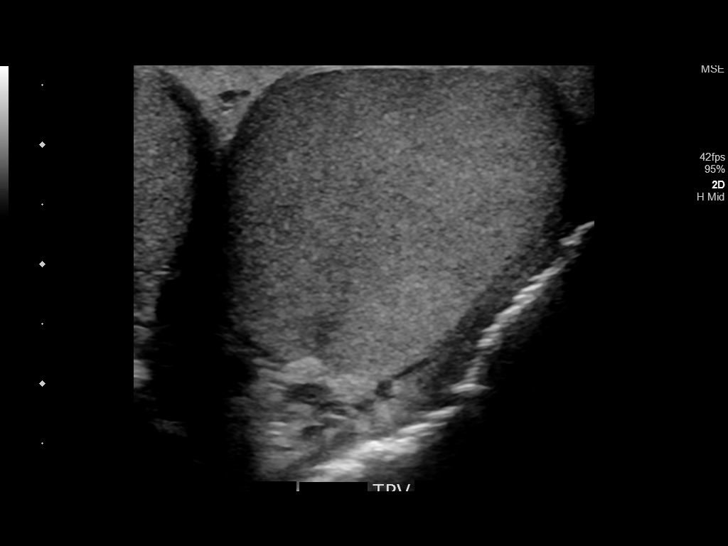
[im 20/32]
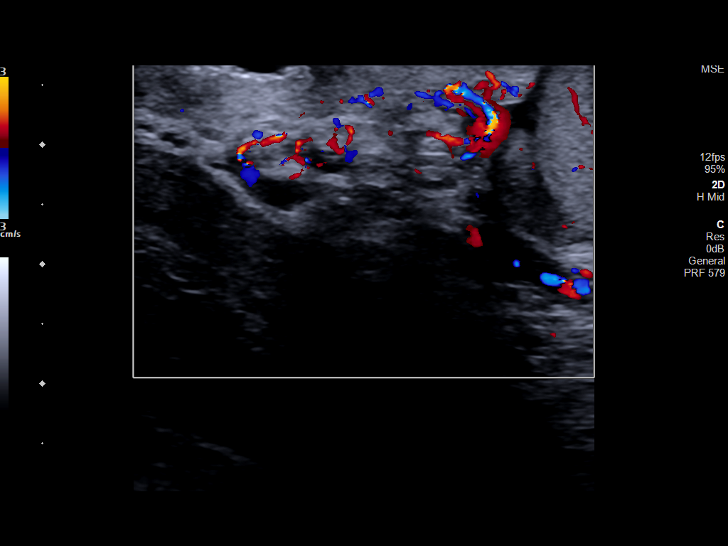
[im 23/32]
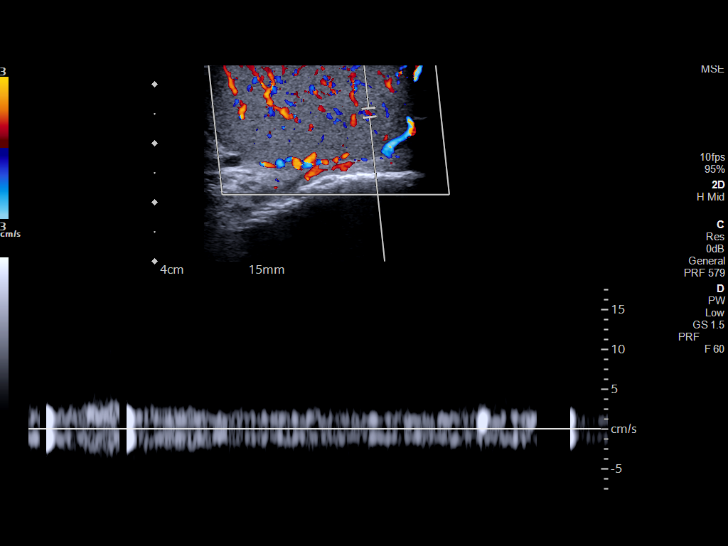
[im 26/32]
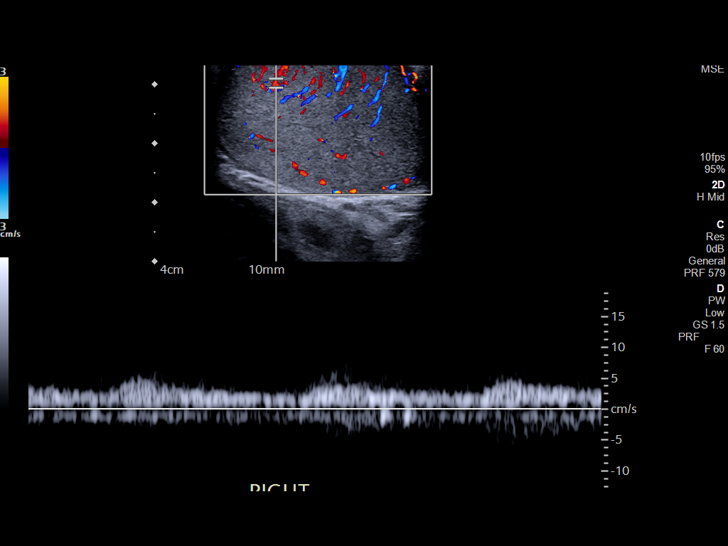
[im 29/32]
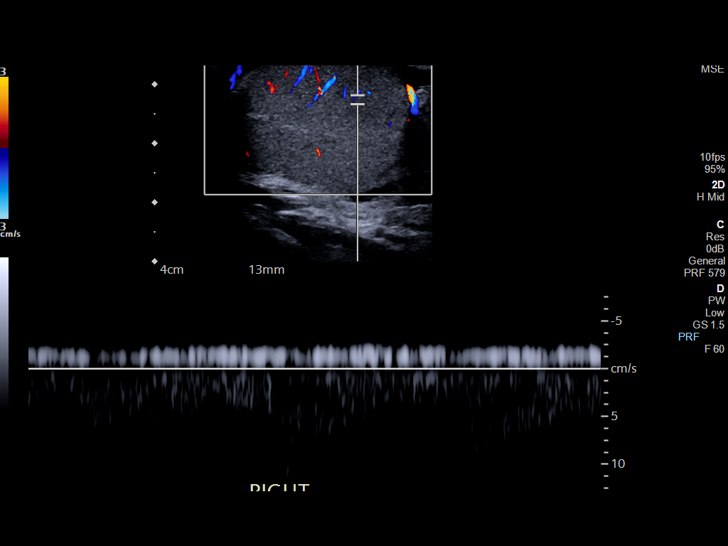
[im 32/32]
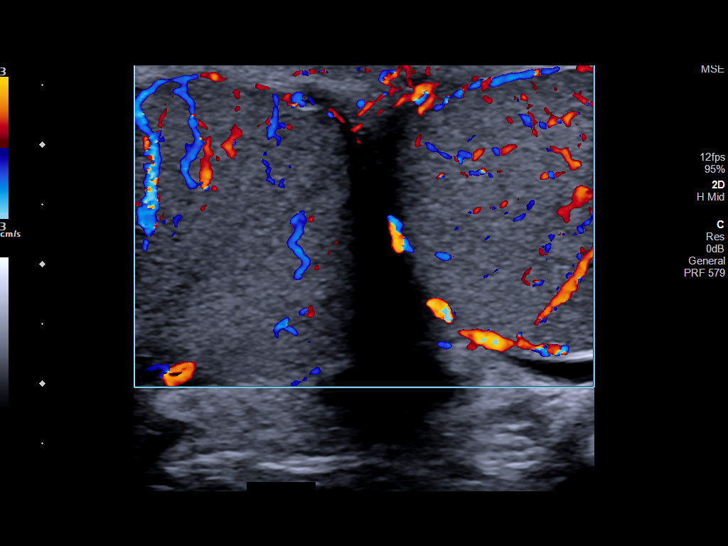

[Series 1002: us scrotum w/doppler · 1 of 2 slices shown (2 of 2)]
[im 1/2]
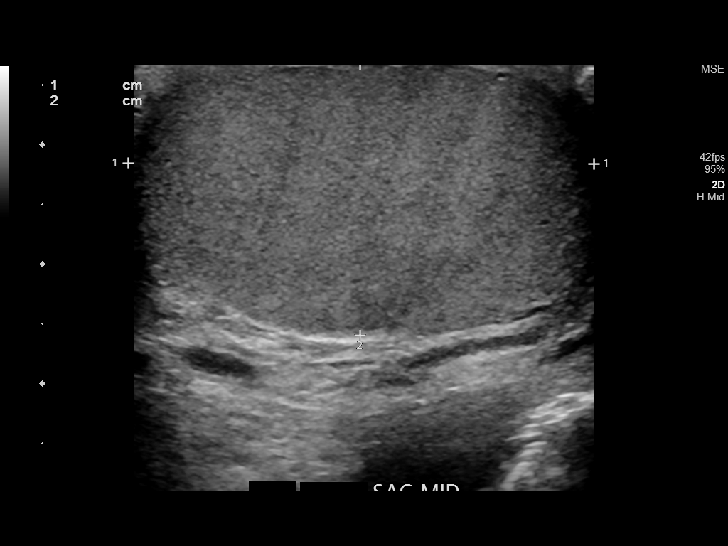

[13 of 25 positions shown; findings below may reference images not displayed]

FINDINGS: Right testicle

Measurements: 4.0 x 2.8 x 2.5 cm. No mass or microlithiasis
visualized.

Left testicle

Measurements: 3.9 x 2.9 x 2.3 cm. No mass or microlithiasis
visualized.

Right epididymis:  Normal in size and appearance.

Left epididymis:  Normal in size and appearance.

Hydrocele:  None visualized.

Varicocele:  None visualized.

Pulsed Doppler interrogation of both testes demonstrates normal low
resistance arterial and venous waveforms bilaterally.
IMPRESSION: Unremarkable scrotal ultrasound. No findings of testicular torsion
or epididymo-orchitis.

## 2023-02-10 ENCOUNTER — Other Ambulatory Visit: Payer: Self-pay

## 2023-02-10 ENCOUNTER — Emergency Department: Payer: Self-pay

## 2023-02-10 ENCOUNTER — Emergency Department
Admission: EM | Admit: 2023-02-10 | Discharge: 2023-02-10 | Disposition: A | Discharge status: Home / Self Care | Payer: Self-pay | Attending: Emergency Medicine | Admitting: Emergency Medicine

## 2023-02-10 DIAGNOSIS — S80811A Abrasion, right lower leg, initial encounter: Secondary | ICD-10-CM | POA: Insufficient documentation

## 2023-02-10 DIAGNOSIS — S51851A Open bite of right forearm, initial encounter: Secondary | ICD-10-CM | POA: Insufficient documentation

## 2023-02-10 DIAGNOSIS — Z23 Encounter for immunization: Secondary | ICD-10-CM | POA: Insufficient documentation

## 2023-02-10 DIAGNOSIS — Z203 Contact with and (suspected) exposure to rabies: Secondary | ICD-10-CM | POA: Insufficient documentation

## 2023-02-10 DIAGNOSIS — W540XXA Bitten by dog, initial encounter: Secondary | ICD-10-CM | POA: Insufficient documentation

## 2023-02-10 DIAGNOSIS — S40212A Abrasion of left shoulder, initial encounter: Secondary | ICD-10-CM | POA: Insufficient documentation

## 2023-02-10 MED ORDER — RABIES IMMUNE GLOBULIN 150 UNIT/ML IM INJ
20.0000 [IU]/kg | INJECTION | Freq: Once | INTRAMUSCULAR | Status: DC
  Filled 2023-02-10: qty 8

## 2023-02-10 MED ORDER — AMOXICILLIN-POT CLAVULANATE 875-125 MG PO TABS
1.0000 | ORAL_TABLET | Freq: Once | ORAL | Status: AC
  Administered 2023-02-10: 1 via ORAL
  Filled 2023-02-10: qty 1

## 2023-02-10 MED ORDER — TETANUS-DIPHTH-ACELL PERTUSSIS 5-2.5-18.5 LF-MCG/0.5 IM SUSY
0.5000 mL | PREFILLED_SYRINGE | Freq: Once | INTRAMUSCULAR | Status: AC
  Administered 2023-02-10: 0.5 mL via INTRAMUSCULAR
  Filled 2023-02-10: qty 0.5

## 2023-02-10 MED ORDER — KETOROLAC TROMETHAMINE 15 MG/ML IJ SOLN
15.0000 mg | Freq: Once | INTRAMUSCULAR | Status: AC
  Administered 2023-02-10: 15 mg via INTRAVENOUS
  Filled 2023-02-10: qty 1

## 2023-02-10 MED ORDER — RABIES VACCINE, PCEC IM SUSR
1.0000 mL | Freq: Once | INTRAMUSCULAR | Status: AC
  Administered 2023-02-10: 1 mL via INTRAMUSCULAR
  Filled 2023-02-10: qty 1

## 2023-02-10 MED ORDER — AMOXICILLIN-POT CLAVULANATE 875-125 MG PO TABS: 1.0000 | ORAL_TABLET | Freq: Two times a day (BID) | ORAL | 0 refills | Status: DC

## 2023-02-10 NOTE — ED Provider Notes (Signed)
[facility] Provider Note    Event Date/Time   First MD Initiated Contact with Patient 02/10/23 1744     (approximate)   History   Chief Complaint: Animal Bite   HPI  Bill Anderson is a 32 y.o. male who complains of pain in the right forearm left shoulder and right lower leg after dog bite from multiple dogs.  Patient offers inconsistent details about the dog encounter including whether he knows these dogs, whether the attack was provoked, and whether they can be observed or located.  When I explained that rabies is a universally fatal disease and emphasize why this information is important and pressed for details, patient says I should just give him the shots for rabies.       Physical Exam   Triage Vital Signs: ED Triage Vitals  Enc Vitals Group     BP 02/10/23 1745 112/74     Pulse Rate 02/10/23 1745 73     Resp 02/10/23 1745 20     Temp --      Temp src --      SpO2 02/10/23 1745 97 %     Weight --      Height --      Head Circumference --      Peak Flow --      Pain Score 02/10/23 1740 9     Pain Loc --      Pain Edu? --      Excl. in [place]? --     Most recent vital signs: Vitals:   02/10/23 1930 02/10/23 2000  BP: 128/88 127/84  Pulse: 89 90  Resp:    SpO2: 91% 97%    General: Awake, no distress.  CV:  Good peripheral perfusion.  Regular rate and rhythm, normal distal pulses, no hemorrhaging Resp:  Normal effort.  Abd:  No distention.  Soft nontender Other:  Contusion and abrasion over the left shoulder.  Also several abrasions consistent with dog bite over the right tibia with tenderness in the area.  No open wound.  Also multiple small lacerations into the subcutaneous layer over the right forearm, each less than 1 cm in length.  No bleeding.  Tissues are soft   ED Results / Procedures / Treatments   Labs (all labs ordered are listed, but only abnormal results are displayed) Labs Reviewed - No data to  display   EKG    RADIOLOGY X-ray right forearm interpreted by me, negative for fracture or foreign body.  Radiology report reviewed Patient refused x-ray of left shoulder per x-ray tech Patient refused x-ray of right tibia/fib per x-ray tech   PROCEDURES:  Procedures   MEDICATIONS ORDERED IN ED: Medications  rabies immune globulin (HYPERRAB/KEDRAB) injection 975 Units (975 Units Intramuscular Not Given 02/10/23 1949)  rabies vaccine (RABAVERT) injection 1 mL (1 mL Intramuscular Given 02/10/23 1836)  Tdap (BOOSTRIX) injection 0.5 mL (0.5 mLs Intramuscular Given 02/10/23 1835)  amoxicillin-clavulanate (AUGMENTIN) 875-125 MG per tablet 1 tablet (1 tablet Oral Given 02/10/23 1832)  ketorolac (TORADOL) 15 MG/ML injection 15 mg (15 mg Intravenous Given 02/10/23 1834)     IMPRESSION / MDM / ASSESSMENT AND PLAN / ED COURSE  I reviewed the triage vital signs and the nursing notes.    Patient's presentation is most consistent with acute presentation with potential threat to life or bodily function.  Patient presents after multiple dog bites including over the left shoulder, right forearm, and right lower leg.  Because  he was not willing to provide detailed history, he was ordered rabies postexposure prophylaxis immunoglobulin and vaccine.  Patient refused x-rays of left shoulder and right lower leg.  Patient refused rabies immunoglobulin.  He did receive a rabies vaccine dose and tetanus vaccine.    Wound care was provided.  Wounds were dressed.  No suturing required.  Tetanus updated.  Augmentin dose given and prescription given.       FINAL CLINICAL IMPRESSION(S) / ED DIAGNOSES   Final diagnoses:  Dog bite, initial encounter  Need for rabies vaccination  Need for post exposure prophylaxis for rabies     Rx / DC Orders   ED Discharge Orders          Ordered    amoxicillin-clavulanate (AUGMENTIN) 875-125 MG tablet  2 times daily        02/10/23 2023              Note:  This document was prepared using Dragon voice recognition software and may include unintentional dictation errors.   Carrie Mew, MD 02/11/23 0000

## 2023-02-10 NOTE — ED Notes (Signed)
Wound irrigated with NS, dressed with Xeroform and Kerlex. Discussed risk of death for not taking all vaccinations for rabies. He stated "I know those dogs don't have rabies. You can't make me get the shots." He also expressed that he would not return to complete the series.

## 2023-02-10 NOTE — ED Notes (Signed)
Pt reports that he was walking in the woods when multiple dogs attacked him. States he knows the owner of 1 dog but that dog didn't really attack him. [place]. Dog bite reported to [facility] non emergent line, will send officer here to talk to patient.

## 2023-02-10 NOTE — ED Triage Notes (Addendum)
Pt comes with c/o dog bite. Pt has bite and several puncture wounds to right arm. Pt states he was outside and just got attacked. Pt also states right shoulder and leg wounds. Pt is very pale in appearance. Pt states he thinks it was a Dentist.  Pt taken straight to room given appearance of pt and that the pt kept dozing out with this RN. Pt admits to weed use but denies any other drugs.   Unsure if this has been reported to animal control

## 2023-02-10 NOTE — ED Notes (Signed)
Received pt from triage, pale looking. This RN and tech asked pt to remove clothes to better assess wounds, pt appeared to be very irritated. Explained that bites need to be assessed, pt then remove clothes. Pt answering questions appropriately but some words are incomprehensible. When asked if pt do drugs/alcohol, pt states "not really" but admitted to smoking weeds. Pt appears irritated when asked assessment questions. Unknown last tetanus vaccine.

## 2023-02-10 NOTE — ED Notes (Signed)
Provided pt with extra dressing supplies, instructed him to take all the antibiotics and keep wounds clean and dry. Pt stated understanding.

## 2023-02-10 NOTE — Discharge Instructions (Addendum)
Your dog bites are at high risk of infection, so you should take Augmentin 2 times a day for the next 10 days.  If you have worsening swelling redness or thick drainage from any wounds, you should return to the hospital for further evaluation.  You need to return to urgent care or the hospital for a series of rabies immunizations to ensure that you do not get infected with rabies.  People who get infected with rabies always die.  You should return on the following days for vaccines: In 3 days, on March 20 In 7 days, on March 24 In 14 days on March 31

## 2023-02-12 ENCOUNTER — Emergency Department
Admission: EM | Admit: 2023-02-12 | Discharge: 2023-02-12 | Disposition: A | Discharge status: Home / Self Care | Payer: Self-pay | Attending: Emergency Medicine | Admitting: Emergency Medicine

## 2023-02-12 ENCOUNTER — Other Ambulatory Visit: Payer: Self-pay

## 2023-02-12 ENCOUNTER — Encounter: Payer: Self-pay | Admitting: Intensive Care

## 2023-02-12 ENCOUNTER — Encounter: Payer: Self-pay | Admitting: Emergency Medicine

## 2023-02-12 DIAGNOSIS — W540XXD Bitten by dog, subsequent encounter: Secondary | ICD-10-CM

## 2023-02-12 DIAGNOSIS — Z23 Encounter for immunization: Secondary | ICD-10-CM | POA: Insufficient documentation

## 2023-02-12 DIAGNOSIS — W540XXA Bitten by dog, initial encounter: Secondary | ICD-10-CM | POA: Insufficient documentation

## 2023-02-12 MED ORDER — AMOXICILLIN-POT CLAVULANATE 875-125 MG PO TABS
1.0000 | ORAL_TABLET | Freq: Two times a day (BID) | ORAL | 0 refills | Status: AC
  Filled 2023-02-12: qty 14, 7d supply, fill #0

## 2023-02-12 MED ORDER — RABIES IMMUNE GLOBULIN 150 UNIT/ML IM INJ
20.0000 [IU]/kg | INJECTION | Freq: Once | INTRAMUSCULAR | Status: AC
  Administered 2023-02-12: 975 [IU]
  Filled 2023-02-12: qty 8

## 2023-02-12 MED ORDER — RABIES VACCINE, PCEC IM SUSR
1.0000 mL | Freq: Once | INTRAMUSCULAR | Status: AC
  Administered 2023-02-12: 1 mL via INTRAMUSCULAR
  Filled 2023-02-12: qty 1

## 2023-02-12 MED ORDER — AMOXICILLIN-POT CLAVULANATE 875-125 MG PO TABS
1.0000 | ORAL_TABLET | Freq: Once | ORAL | Status: AC
  Administered 2023-02-12: 1 via ORAL
  Filled 2023-02-12: qty 1

## 2023-02-12 NOTE — ED Provider Notes (Signed)
[facility] Provider Note    Event Date/Time   First MD Initiated Contact with Patient 02/12/23 1356     (approximate)   History   Rabies Injection   HPI  Bill Anderson is a 32 y.o. male with history of dog bite who comes in with concern for right arm pain.  Patient reports that he is currently homeless.  He states he has not been able to get the antibiotics and he was told to come here in order to try to get assistance with the antibiotics.  He did have an x-ray of the arm that was without any fractures or retained foreign bodies.  He states he is here for his rabies shot.  On review of records he is actually not due for his rabies shot until tomorrow he also had refused the immunoglobulin at that time.  Patient states that he does not recall refusing this earlier.  He remembers refusing the x-rays because those other areas where he was bit are very minimal in nature.   Physical Exam   Triage Vital Signs: ED Triage Vitals  Enc Vitals Group     BP --      Pulse --      Resp --      Temp --      Temp src --      SpO2 --      Weight 02/12/23 1337 110 lb (49.9 kg)     Height 02/12/23 1337 5\' 3"  (1.6 m)     Head Circumference --      Peak Flow --      Pain Score 02/12/23 1336 0     Pain Loc --      Pain Edu? --      Excl. in [place]? --     Most recent vital signs:  General: Awake, no distress.  CV:  Good peripheral perfusion.  Resp:  Normal effort.  Abd:  No distention.  Other:  Patient has swelling to the right arm but no cellulitis is noted.  He is got good distal pulse.   ED Results / Procedures / Treatments   Labs (all labs ordered are listed, but only abnormal results are displayed) Labs Reviewed - No data to display PROCEDURES:  Critical Care performed: No  Procedures   MEDICATIONS ORDERED IN ED: Medications  amoxicillin-clavulanate (AUGMENTIN) 875-125 MG per tablet 1 tablet (1 tablet Oral Given 02/12/23 1431)  rabies immune  globulin (HYPERRAB/KEDRAB) injection 975 Units (975 Units Infiltration Given 02/12/23 1507)  rabies vaccine (RABAVERT) injection 1 mL (1 mL Intramuscular Given 02/12/23 1522)     IMPRESSION / MDM / ASSESSMENT AND PLAN / ED COURSE  I reviewed the triage vital signs and the nursing notes.   Patient's presentation is most consistent with acute, uncomplicated illness.   At this time it just seems consistent with dog bite.  We unwrapped the room recleansed and rewrapped it.  Do not see any evidence of cellulitis.  Good distal pulse.  He is got swelling noted but seems just be from the dog bite.  Discussed with pharmracy Hallaji-okay to give the immunoglobulin up to 7 days from the first vaccine.  Discussed with patient is not due for the second vaccine until tomorrow.  Patient states he does not have a ride to come tomorrow.  I discussed with him that not following the schedule that is advised cincreased risk for getting rabies if the dog had rabies.  Patient states that  there is no way for him to come tomorrow as I discussed with pharmacy and given there is no clear contraindication to giving it early I think that it is better to give it now than 2 potentially skip the whole dose.  We will continue his other vaccines on day 7 days 14 and I did discuss this with patient.  I did discuss with social work to try to get him Augmentin outpatient for the bites.  Patient was instructed when to go to get the Augmentin.   To note patient also refused getting the immunoglobulin injected into the wound itself.  He states that he is unwilling to do that he was willing to have it injected into the arm.  We again discussed the risk for increasing risk for rabies without it being as close to the source is possible but he again continues to decline understands this risk but does not want the immunoglobulin injected into the wound    FINAL CLINICAL IMPRESSION(S) / ED DIAGNOSES   Final diagnoses:  Dog bite, subsequent  encounter  Rabies, need for prophylactic vaccination against     Rx / DC Orders   ED Discharge Orders          Ordered    amoxicillin-clavulanate (AUGMENTIN) 875-125 MG tablet  2 times daily        02/12/23 1521             Note:  This document was prepared using Dragon voice recognition software and may include unintentional dictation errors.   Vanessa [place], MD 02/12/23 1535

## 2023-02-12 NOTE — Progress Notes (Signed)
Patient to discharge home today on PO antibiotics, will receive from [facility] [place] free of charge due to no insurance, MD and paramedic aware.   Kelby Fam, [place], MSW, [place]

## 2023-02-12 NOTE — ED Triage Notes (Signed)
Patient reports he is here for his next rabies shot and to have his wound rewrapped.

## 2023-02-12 NOTE — Discharge Instructions (Addendum)
Please go to [facility] building across the parking lot to get the augmentin to prevent infection  Your dog bites are at high risk of infection, so you should take Augmentin 2 times a day for the next 10 days.  If you have worsening swelling redness or thick drainage from any wounds, you should return to the hospital for further evaluation.  You need to return to urgent care or the hospital for a series of rabies immunizations to ensure that you do not get infected with rabies.  People who get infected with rabies always die.  You should return on the following days for vaccines:  In 7 days, on March 24 In 14 days on March 31

## 2023-02-22 ENCOUNTER — Other Ambulatory Visit: Payer: Self-pay

## 2023-03-01 ENCOUNTER — Telehealth: Payer: Self-pay | Admitting: Surgery

## 2023-03-01 DIAGNOSIS — W540XXD Bitten by dog, subsequent encounter: Secondary | ICD-10-CM

## 2023-03-01 NOTE — Telephone Encounter (Signed)
History: Patient had been attacked by 3 dogs, taken to ED by resident who heard the attack and scared off dogs with his gun. Pt was seen in ED 02/10/2023 for wound care and accepted 1 dose rabies vaccine. Refused rabies IG. Patient came back day 2 (instead of day 3 as instructed) for 2nd rabies shot, got 2nd shot and also accepted rabies Ig but refused infusion into the wound. Patient never came back for 3rd and 4th dose rabies vaccines. Patient is homeless, lives in woods behind some peoples homes.   Dr. Wyvonnia Lora & Tracey Harries RN visited pt w/police escort at patient's tent in the woods in [facility]. We enquired as to the pt's wounds and well being. The patient reports his wounds have healed well, he feels great, he demonstrated by lifting heavy tree branches with his previously injured arm.  He insisted that he does NOT want 2 more rabies shots, he "hates shots." We informed him that he needs an additional two rabies vaccinations to complete the series to be fully protected. He reiterated that it was his choice and he did not want any additional treatment. I gave him a letter advising him to complete the rabies treatment and we reiterated that rabies may take some time to develop. If he changes his mind, contact information was given to patient in his closure letter.  When asked about whether or not there was someone who could take him to the hospital, he reported that he lived on the property of Rometta Emery at [address] [place], [place] [address], [phone]. Per the patient, she allows him to live on the property in his tent in exchange for help with her home. He stated that if he were to need transportation to the hospital, she would be willing and able to take him.   Patient is now closed to further follow up.  Jennye Moccasin, MD

## 2024-05-30 ENCOUNTER — Emergency Department
Admission: EM | Admit: 2024-05-30 | Discharge: 2024-05-30 | Disposition: A | Discharge status: Home / Self Care | Payer: Self-pay | Attending: Emergency Medicine | Admitting: Emergency Medicine

## 2024-05-30 ENCOUNTER — Other Ambulatory Visit: Payer: Self-pay

## 2024-05-30 DIAGNOSIS — T675XXA Heat exhaustion, unspecified, initial encounter: Secondary | ICD-10-CM | POA: Insufficient documentation

## 2024-05-30 LAB — BASIC METABOLIC PANEL WITH GFR
Anion gap: 14 (ref 5–15)
BUN: 19 mg/dL (ref 6–20)
CO2: 22 mmol/L (ref 22–32)
Calcium: 9.8 mg/dL (ref 8.9–10.3)
Chloride: 100 mmol/L (ref 98–111)
Creatinine, Ser: 1.35 mg/dL — ABNORMAL HIGH (ref 0.61–1.24)
GFR, Estimated: >60 mL/min (ref >60)
Glucose, Bld: 98 mg/dL (ref 70–99)
Potassium: 4.5 mmol/L (ref 3.5–5.1)
Sodium: 136 mmol/L (ref 135–145)

## 2024-05-30 LAB — CBC
HCT: 44.7 % (ref 39.0–52.0)
Hemoglobin: 14.9 g/dL (ref 13.0–17.0)
MCH: 29 pg (ref 26.0–34.0)
MCHC: 33.3 g/dL (ref 30.0–36.0)
MCV: 87.1 fL (ref 80.0–100.0)
Platelets: 328 K/uL (ref 150–400)
RBC: 5.13 MIL/uL (ref 4.22–5.81)
RDW: 12.4 % (ref 11.5–15.5)
WBC: 16.5 K/uL — ABNORMAL HIGH (ref 4.0–10.5)
nRBC: 0 % (ref 0.0–0.2)

## 2024-05-30 MED ORDER — SODIUM CHLORIDE 0.9 % IV SOLN
Freq: Once | INTRAVENOUS | Status: AC
Start: 2024-05-30 — End: 2024-05-30

## 2024-05-30 NOTE — ED Notes (Signed)
 MD at bedside. Attempting to assess pt.

## 2024-05-30 NOTE — ED Notes (Signed)
MD made aware of pt behaviors

## 2024-05-30 NOTE — ED Notes (Signed)
 RN to bedside to d/c patient. Patient states he wants to wait until his fluids are finished. RN attempted to get repeat set of vital signs prior to d/c. Pt refusing.

## 2024-05-30 NOTE — ED Triage Notes (Addendum)
 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 Resident called to have him off the property for trespassing. States he was outside and over heated with N/V and abdominal discomfort. Patient is anxious and rambling. Possible psych issues. Refusing all labs in triage

## 2024-05-30 NOTE — ED Provider Notes (Signed)
 [facility] Provider Note    Event Date/Time   First MD Initiated Contact with Patient 05/30/24 1139     (approximate)   History   Heat Exposure and Psychiatric Evaluation   HPI  Bill Anderson is a 33 y.o. male with a history of bipolar disorder who presents with reported heat exposure.  Patient states that he was in the heat and his new landlady and the police would not let him go inside and cool down and he started to feel weak.  He feels better now.  He states that his bipolar disorder is under control, he is no longer on medications for it.  He has no HI or SI.     Physical Exam   Triage Vital Signs: ED Triage Vitals  Encounter Vitals Group     BP 05/30/24 1120 (!) 120/93     Girls Systolic BP Percentile --      Girls Diastolic BP Percentile --      Boys Systolic BP Percentile --      Boys Diastolic BP Percentile --      Pulse Rate 05/30/24 1120 (!) 119     Resp 05/30/24 1120 18     Temp 05/30/24 1120 98.8 F (37.1 C)     Temp Source 05/30/24 1120 Oral     SpO2 05/30/24 1120 100 %     Weight --      Height --      Head Circumference --      Peak Flow --      Pain Score 05/30/24 1121 1     Pain Loc --      Pain Education --      Exclude from Growth Chart --     Most recent vital signs: Vitals:   05/30/24 1120  BP: (!) 120/93  Pulse: (!) 119  Resp: 18  Temp: 98.8 F (37.1 C)  SpO2: 100%     General: Awake, no distress.  CV:  Good peripheral perfusion.  Resp:  Normal effort.  Abd:  No distention.  Other:  Psych: Does not appear to be manic, no psychosis, no SI or HI   ED Results / Procedures / Treatments   Labs (all labs ordered are listed, but only abnormal results are displayed) Labs Reviewed  CBC - Abnormal; Notable for the following components:      Result Value   WBC 16.5 (*)    All other components within normal limits  BASIC METABOLIC PANEL WITH GFR - Abnormal; Notable for the following components:    Creatinine, Ser 1.35 (*)    All other components within normal limits     EKG     RADIOLOGY     PROCEDURES:  Critical Care performed:   Procedures   MEDICATIONS ORDERED IN ED: Medications  0.9 %  sodium chloride  infusion ( Intravenous New Bag/Given 05/30/24 1207)     IMPRESSION / MDM / ASSESSMENT AND PLAN / ED COURSE  I reviewed the triage vital signs and the nursing notes. Patient's presentation is most consistent with acute illness / injury with system symptoms.  Patient presents with likely heat exhaustion, felt weak after being in the heat for prolonged period of time.  Does have a history of bipolar disorder, evaluation here is generally reassuring.  No psychosis, no SI or HI, no mania  Lab work is reassuring, patient treatment IV fluids and is feeling much better.,  No indication for admission at this time  FINAL CLINICAL IMPRESSION(S) / ED DIAGNOSES   Final diagnoses:  Heat exhaustion, initial encounter     Rx / DC Orders   ED Discharge Orders     None        Note:  This document was prepared using Dragon voice recognition software and may include unintentional dictation errors.   Arlander [place], MD 05/30/24 [phone]

## 2024-05-30 NOTE — ED Notes (Addendum)
 Henry RN moving pt to 24H. Pt stating he knows his rights and he will not allow this RN to assess him. Speech pressured. Pt excessively loud yelling at staff. Pt cursing at staff. Pt refusing to dress into mental health scrubs. Pt placed in 24H until further MD evaluation.

## 2024-05-30 NOTE — ED Triage Notes (Signed)
 First Nurse Note:  Pt was living in a tent on private property.  Sheriff's department was called to get the patient removed.  Pt had no where to go, so EMS was called.    Pt had no real medical complaints except wanting help w/ drug use.  Vitals: Temp- 97.3 HR- 60 BP- 121/96 O2- 97%

## 2024-05-30 NOTE — ED Notes (Addendum)
 Pt cursing at [employer] officer.

## 2024-05-31 ENCOUNTER — Other Ambulatory Visit: Payer: Self-pay

## 2024-05-31 ENCOUNTER — Emergency Department
Admission: EM | Admit: 2024-05-31 | Discharge: 2024-05-31 | Discharge status: Against Medical Advice | Payer: Self-pay | Attending: Emergency Medicine | Admitting: Emergency Medicine

## 2024-05-31 DIAGNOSIS — Z5321 Procedure and treatment not carried out due to patient leaving prior to being seen by health care provider: Secondary | ICD-10-CM | POA: Insufficient documentation

## 2024-05-31 DIAGNOSIS — Z1330 Encounter for screening examination for mental health and behavioral disorders, unspecified: Secondary | ICD-10-CM | POA: Insufficient documentation

## 2024-05-31 NOTE — ED Notes (Signed)
 Pt was dropped off by [employer] per the pt. Pt is refusing to dress out. Pt is voluntary. Pt was advised he was free to go if he was not going to be cooperative and dress out per policy.

## 2024-05-31 NOTE — ED Triage Notes (Signed)
 Pt to ed from home the streets for mental health eval. Pt is talking out of his head and makes no sense. Making things up. Initially refusing vitals but then allows us  to get his vitals. Pt is alert and in no distress.

## 2024-05-31 NOTE — ED Notes (Signed)
 Pt refusing to dress out. Pt was educated on the policy. Pt out to waiting room yelling. Pt escorted out by security
